# Patient Record
Sex: Male | Born: 1990 | Race: Black or African American | Hispanic: No | Marital: Single | Smoking: Current every day smoker
Health system: Southern US, Community
[De-identification: ages and names within clinical notes are randomized; demographics above are authoritative.]

## PROBLEM LIST (undated history)

## (undated) DIAGNOSIS — Z8249 Family history of ischemic heart disease and other diseases of the circulatory system: Secondary | ICD-10-CM

## (undated) DIAGNOSIS — F17200 Nicotine dependence, unspecified, uncomplicated: Secondary | ICD-10-CM

## (undated) HISTORY — DX: Nicotine dependence, unspecified, uncomplicated: F17.200

## (undated) HISTORY — DX: Family history of ischemic heart disease and other diseases of the circulatory system: Z82.49

## (undated) HISTORY — DX: Morbid (severe) obesity due to excess calories: E66.01

---

## 2011-05-11 ENCOUNTER — Emergency Department (HOSPITAL_COMMUNITY)
Admission: EM | Admit: 2011-05-11 | Discharge: 2011-05-11 | Disposition: A | Discharge status: Home / Self Care | Payer: Self-pay | Attending: Emergency Medicine | Admitting: Emergency Medicine

## 2011-05-11 DIAGNOSIS — K6289 Other specified diseases of anus and rectum: Secondary | ICD-10-CM | POA: Insufficient documentation

## 2011-05-11 DIAGNOSIS — K644 Residual hemorrhoidal skin tags: Secondary | ICD-10-CM | POA: Insufficient documentation

## 2011-07-23 ENCOUNTER — Emergency Department (HOSPITAL_COMMUNITY)
Admission: EM | Admit: 2011-07-23 | Discharge: 2011-07-24 | Disposition: A | Discharge status: Home / Self Care | Payer: Self-pay | Attending: Emergency Medicine | Admitting: Emergency Medicine

## 2011-07-23 DIAGNOSIS — E860 Dehydration: Secondary | ICD-10-CM | POA: Insufficient documentation

## 2011-07-23 DIAGNOSIS — R5381 Other malaise: Secondary | ICD-10-CM | POA: Insufficient documentation

## 2011-07-23 DIAGNOSIS — R110 Nausea: Secondary | ICD-10-CM | POA: Insufficient documentation

## 2011-07-23 LAB — URINALYSIS, ROUTINE W REFLEX MICROSCOPIC
Ketones, ur: 15 mg/dL — AB
Leukocytes, UA: NEGATIVE
Nitrite: NEGATIVE
Protein, ur: NEGATIVE mg/dL
pH: 6 (ref 5.0–8.0)

## 2011-07-23 LAB — PREGNANCY, URINE: Preg Test, Ur: NEGATIVE

## 2012-06-09 ENCOUNTER — Emergency Department (HOSPITAL_COMMUNITY)
Admission: EM | Admit: 2012-06-09 | Discharge: 2012-06-09 | Disposition: A | Discharge status: Home / Self Care | Payer: Self-pay | Attending: Emergency Medicine | Admitting: Emergency Medicine

## 2012-06-09 ENCOUNTER — Encounter (HOSPITAL_COMMUNITY): Payer: Self-pay | Admitting: *Deleted

## 2012-06-09 DIAGNOSIS — X12XXXA Contact with other hot fluids, initial encounter: Secondary | ICD-10-CM | POA: Insufficient documentation

## 2012-06-09 DIAGNOSIS — Z23 Encounter for immunization: Secondary | ICD-10-CM | POA: Insufficient documentation

## 2012-06-09 DIAGNOSIS — Z87891 Personal history of nicotine dependence: Secondary | ICD-10-CM | POA: Insufficient documentation

## 2012-06-09 DIAGNOSIS — T22219A Burn of second degree of unspecified forearm, initial encounter: Secondary | ICD-10-CM | POA: Insufficient documentation

## 2012-06-09 DIAGNOSIS — Y93G3 Activity, cooking and baking: Secondary | ICD-10-CM | POA: Insufficient documentation

## 2012-06-09 DIAGNOSIS — Y990 Civilian activity done for income or pay: Secondary | ICD-10-CM | POA: Insufficient documentation

## 2012-06-09 DIAGNOSIS — T300 Burn of unspecified body region, unspecified degree: Secondary | ICD-10-CM

## 2012-06-09 MED ORDER — OXYCODONE-ACETAMINOPHEN 5-325 MG PO TABS: 1.0000 | ORAL_TABLET | ORAL | Status: AC | PRN

## 2012-06-09 MED ORDER — TETANUS-DIPHTH-ACELL PERTUSSIS 5-2.5-18.5 LF-MCG/0.5 IM SUSP
0.5000 mL | Freq: Once | INTRAMUSCULAR | Status: AC
  Administered 2012-06-09: 0.5 mL via INTRAMUSCULAR
  Filled 2012-06-09: qty 0.5

## 2012-06-09 MED ORDER — SILVER SULFADIAZINE 1 % EX CREA: TOPICAL_CREAM | Freq: Every day | CUTANEOUS | Status: DC

## 2012-06-09 NOTE — ED Provider Notes (Signed)
History   This chart was scribed for Forbes Cellar, MD by Toya Smothers. The patient was seen in room TR05C/TR05C. Patient's care was started at 1656.  CSN: [id number]  Arrival date & time 06/09/12  1656   First MD Initiated Contact with Patient 06/09/12 1921      Chief Complaint  Patient presents with  . Burn   HPI  Cody Hughes is a 21 y.o. male who presents to the Emergency Department complaining of moderate severe constant L forearm pain as the result of pain. Pt states that he was burned as the result of a grease burn while working. This morning the burned area blistered, drained,  and pt removed the skin. Today pain increased as and was aggravated by heat as a result of working conditions (continues to work over [employer] with wound exposed) Denies fever/chills/redness/pus drainage from site. Denies numbness/tinglng/weakness extr  ED Notes, ED Provider Notes from 06/09/12 0000 to 06/09/12 17:21:47       Melissa Rolan Bucco, RN 06/09/2012 17:21      To ED for treatment of grease burn to left forearm. Happened while at work last night. Pt states it blistered this am but he popped the blister. Increased pain when pt went to work today     History reviewed. No pertinent past medical history.  History reviewed. No pertinent past surgical history.  History reviewed. No pertinent family history.  History  Substance Use Topics  . Smoking status: Former Games developer  . Smokeless tobacco: Not on file  . Alcohol Use: Yes     occassional    Review of Systems  Skin: Positive for wound (Burn).  All other systems reviewed and are negative.   Allergies  Review of patient's allergies indicates no known allergies.  Home Medications   Current Outpatient Rx  Name Route Sig Dispense Refill  . OXYCODONE-ACETAMINOPHEN 5-325 MG PO TABS Oral Take 1 tablet by mouth every 4 (four) hours as needed for pain. 10 tablet 0  . SILVER SULFADIAZINE 1 % EX CREA Topical Apply topically daily. 50 g 0     BP 128/71  Pulse 78  Temp 98.4 F (36.9 C) (Oral)  Resp 16  SpO2 99%  Physical Exam  Nursing note and vitals reviewed. Constitutional: He is oriented to person, place, and time. He appears well-developed and well-nourished. No distress.  HENT:  Head: Normocephalic and atraumatic.  Eyes: EOM are normal. Pupils are equal, round, and reactive to light. Right eye exhibits no discharge. Left eye exhibits no discharge. No scleral icterus.  Neck: Neck supple. No tracheal deviation present.  Cardiovascular: Normal rate.   Pulmonary/Chest: Effort normal. No respiratory distress.  Abdominal: Soft. He exhibits no distension and no mass.  Musculoskeletal: Normal range of motion. He exhibits no edema.  Neurological: He is alert and oriented to person, place, and time. No cranial nerve deficit or sensory deficit. Coordination normal.  Skin: Skin is warm and dry.       Radial pulse intact 4 1.5 area of exposed skin with no surrounding erythema, with no surrounding edema.   Psychiatric: He has a normal mood and affect. His behavior is normal.    ED Course  Procedures (including critical care time) DIAGNOSTIC STUDIES: Oxygen Saturation is 99% on room air, normal by my interpretation.    COORDINATION OF CARE: 1922- Discussed necessity of Tetanus shot. Evaluated state of Pt's present illness.   Labs Reviewed - No data to display No results found.   1. Burn  MDM  Second degree/SPT burn. No evidence of infection at this time. Tdap given. Pain control, silvadene. Expect it will heal nicely. PMD f/u. No EMC precluding discharge at this time. Given Precautions for return. PMD f/u.   I personally performed the services described in this documentation, which was scribed in my presence. The recorded information has been reviewed and considered.    Forbes Cellar, MD 06/09/12 2007

## 2012-06-09 NOTE — Discharge Instructions (Signed)
Burn Care Your skin is a natural barrier to infection. It is the largest organ of your body. Burns damage this natural protection. To help prevent infection, it is very important to follow your caregiver's instructions in the care of your burn. Burns are classified as:  First degree. There is only redness of the skin (erythema). No scarring is expected.   Second degree. There is blistering of the skin. Scarring may occur with deeper burns.   Third degree. All layers of the skin are injured, and scarring is expected.  HOME CARE INSTRUCTIONS   Wash your hands well before changing your bandage.   Change your bandage as often as directed by your caregiver.   Remove the old bandage. If the bandage sticks, you may soak it off with cool, clean water.   Cleanse the burn thoroughly but gently with mild soap and water.   Pat the area dry with a clean, dry cloth.   Apply a thin layer of antibacterial cream to the burn.   Apply a clean bandage as instructed by your caregiver.   Keep the bandage as clean and dry as possible.   Elevate the affected area for the first 24 hours, then as instructed by your caregiver.   Only take over-the-counter or prescription medicines for pain, discomfort, or fever as directed by your caregiver.  SEEK IMMEDIATE MEDICAL CARE IF:   You develop excessive pain.   You develop redness, tenderness, swelling, or red streaks near the burn.   The burned area develops yellowish-white fluid (pus) or a bad smell.   You have a fever.  MAKE SURE YOU:   Understand these instructions.   Will watch your condition.   Will get help right away if you are not doing well or get worse.  Document Released: 12/03/2005 Document Revised: 11/22/2011 Document Reviewed: 04/25/2011 [facility] Patient Information 2012 [place], [place].  RESOURCE GUIDE  Dental Problems  Patients with Medicaid: [facility] [phone] [address]                                            [phone] [address] Phone:  [phone]                                                   Phone:  [phone]  If unable to pay or uninsured, contact:  [facility] or [facility]. to become qualified for the adult dental clinic.  Chronic Pain Problems Contact [facility]  [phone] Patients need to be referred by their primary care doctor.  Insufficient Money for Medicine Contact United Way:  call "211" or [facility] 520-683-8139.  No Primary Care Doctor Call [facility]  [phone] Other agencies that provide inexpensive medical care    [facility]  [phone]    [facility]  [phone]    [facility]  [phone]    [facility]  [phone]    [facility]  4080258497    [facility]  [phone]  Psychological Services [facility] Behavioral Health  [phone] [facility]  [phone] [facility] Mental Health   [phone] (emergency services [phone])  Abuse/Neglect [facility] Child Abuse Hotline 403-257-3712 [facility] Child Abuse Hotline 662-224-8206 (After Hours)  Emergency Shelter [facility] [phone]  Maternity Homes Room at the [place] [facility] [place] [phone] [facility] [phone]  MRSA Hotline #:   (954)411-5263    [facility] Resources  [facility]  United [employer]                           [facility] Dept. [address] [place]                     [address]         [address]  Blondell Reveal Phone:  [phone]                                  Phone:  [phone]                   Phone:  [phone]  [facility] Mental [facility] Phone:  [phone]  [facility] Child Abuse Hotline [phone] (After Hours)

## 2012-06-09 NOTE — ED Notes (Signed)
To ED for treatment of grease burn to left forearm. Happened while at work last night. Pt states it blistered this am but he popped the blister. Increased pain when pt went to work today

## 2013-03-11 ENCOUNTER — Encounter (HOSPITAL_COMMUNITY): Payer: Self-pay | Admitting: *Deleted

## 2013-03-11 ENCOUNTER — Emergency Department (HOSPITAL_COMMUNITY)
Admission: EM | Admit: 2013-03-11 | Discharge: 2013-03-11 | Disposition: A | Discharge status: Home / Self Care | Payer: Self-pay | Attending: Emergency Medicine | Admitting: Emergency Medicine

## 2013-03-11 DIAGNOSIS — K219 Gastro-esophageal reflux disease without esophagitis: Secondary | ICD-10-CM | POA: Insufficient documentation

## 2013-03-11 DIAGNOSIS — E669 Obesity, unspecified: Secondary | ICD-10-CM | POA: Insufficient documentation

## 2013-03-11 DIAGNOSIS — R112 Nausea with vomiting, unspecified: Secondary | ICD-10-CM | POA: Insufficient documentation

## 2013-03-11 DIAGNOSIS — R197 Diarrhea, unspecified: Secondary | ICD-10-CM | POA: Insufficient documentation

## 2013-03-11 DIAGNOSIS — F17200 Nicotine dependence, unspecified, uncomplicated: Secondary | ICD-10-CM | POA: Insufficient documentation

## 2013-03-11 LAB — URINALYSIS, ROUTINE W REFLEX MICROSCOPIC
Bilirubin Urine: NEGATIVE
Glucose, UA: NEGATIVE mg/dL
Hgb urine dipstick: NEGATIVE
Nitrite: NEGATIVE
Specific Gravity, Urine: 1.026 (ref 1.005–1.030)
pH: 5.5 (ref 5.0–8.0)

## 2013-03-11 LAB — COMPREHENSIVE METABOLIC PANEL
AST: 23 U/L (ref 0–37)
Albumin: 3.4 g/dL — ABNORMAL LOW (ref 3.5–5.2)
BUN: 15 mg/dL (ref 6–23)
Creatinine, Ser: 0.98 mg/dL (ref 0.50–1.35)
Potassium: 4 mEq/L (ref 3.5–5.1)
Total Protein: 6.3 g/dL (ref 6.0–8.3)

## 2013-03-11 LAB — CBC WITH DIFFERENTIAL/PLATELET
Eosinophils Relative: 3 % (ref 0–5)
HCT: 40 % (ref 39.0–52.0)
Lymphocytes Relative: 54 % — ABNORMAL HIGH (ref 12–46)
Lymphs Abs: 2.1 10*3/uL (ref 0.7–4.0)
MCV: 81.3 fL (ref 78.0–100.0)
Monocytes Absolute: 0.3 10*3/uL (ref 0.1–1.0)
Monocytes Relative: 7 % (ref 3–12)
RBC: 4.92 MIL/uL (ref 4.22–5.81)
WBC: 3.9 10*3/uL — ABNORMAL LOW (ref 4.0–10.5)

## 2013-03-11 LAB — LIPASE, BLOOD: Lipase: 29 U/L (ref 11–59)

## 2013-03-11 MED ORDER — ONDANSETRON HCL 4 MG/2ML IJ SOLN
4.0000 mg | Freq: Once | INTRAMUSCULAR | Status: AC
  Administered 2013-03-11: 4 mg via INTRAVENOUS
  Filled 2013-03-11: qty 2

## 2013-03-11 MED ORDER — OMEPRAZOLE 20 MG PO CPDR: 20.0000 mg | DELAYED_RELEASE_CAPSULE | Freq: Every day | ORAL | Status: DC

## 2013-03-11 MED ORDER — SUCRALFATE 1 G PO TABS: 1.0000 g | ORAL_TABLET | Freq: Four times a day (QID) | ORAL | Status: DC

## 2013-03-11 MED ORDER — SODIUM CHLORIDE 0.9 % IV BOLUS (SEPSIS)
1000.0000 mL | Freq: Once | INTRAVENOUS | Status: AC
  Administered 2013-03-11: 1000 mL via INTRAVENOUS

## 2013-03-11 MED ORDER — PANTOPRAZOLE SODIUM 40 MG PO TBEC
40.0000 mg | DELAYED_RELEASE_TABLET | Freq: Once | ORAL | Status: AC
  Administered 2013-03-11: 40 mg via ORAL
  Filled 2013-03-11: qty 1

## 2013-03-11 MED ORDER — GI COCKTAIL ~~LOC~~
30.0000 mL | Freq: Once | ORAL | Status: AC
  Administered 2013-03-11: 30 mL via ORAL
  Filled 2013-03-11: qty 30

## 2013-03-11 NOTE — ED Notes (Signed)
Pt to ED c/o emesis followed by epigastric pain and diarrhea.  Denies fevers.

## 2013-03-11 NOTE — ED Provider Notes (Signed)
History     CSN: [id number]  Arrival date & time 03/11/13  1826   First MD Initiated Contact with Patient 03/11/13 1903      Chief Complaint  Patient presents with  . Abdominal Pain  . Emesis    (Consider location/radiation/quality/duration/timing/severity/associated sxs/prior treatment) HPI Cody Hughes is a 22 year old male who presents to the ED for abdominal pain and diarrhea. The abdominal pain started yesterday while he was on a walk. He reports the pain is intermittent, a sharp, burning sensation in his epigastric region, and associated with nausea. He states the pain is usually worse with meals and fluids and reports eating a diet high in fat. Yesterday he had pizza for lunch and dinner. Today he had a [place] sub for lunch. The pain is worse with movement and better at rest and lying down. He had one episode of emesis this morning, but states it was a small amount, and watery.  He has also had 5 episodes of diarrhea since yesterday. He states it is watery and dark brown. He denies fever, chills, chest pain, SOB, hematemesis, hematochezia, melena, or dysuria. He denies any known sick contacts. He has not tried any medications for the pain or diarrhea. He denies taking excessive NSAIDs. He is a current smoker and smokes about 1/2 pack a day, but since the pain started he states he only smoked 3 cigarettes today.  History reviewed. No pertinent past medical history.  History reviewed. No pertinent past surgical history.  No family history on file.  History  Substance Use Topics  . Smoking status: Current Every Day Smoker -- 0.50 packs/day    Types: Cigarettes  . Smokeless tobacco: Not on file  . Alcohol Use: Yes     Comment: occassional      Review of Systems All other systems negative except as documented in the HPI. All pertinent positives and negatives as reviewed in the HPI.  Allergies  Review of patient's allergies indicates no known allergies.  Home Medications   No current outpatient prescriptions on file.  BP 126/70  Pulse 79  Temp(Src) 98.3 F (36.8 C) (Oral)  Resp 18  SpO2 99%  Physical Exam  Nursing note and vitals reviewed. Constitutional: He is oriented to person, place, and time. He appears well-developed and well-nourished. No distress.  Obese.  HENT:  Head: Normocephalic and atraumatic.  Mouth/Throat: Oropharynx is clear and moist.  Eyes: Pupils are equal, round, and reactive to light.  Neck: Normal range of motion. Neck supple.  Cardiovascular: Normal rate, regular rhythm and normal heart sounds.  Exam reveals no gallop and no friction rub.   No murmur heard. Pulmonary/Chest: Effort normal and breath sounds normal.  Abdominal: Soft. Bowel sounds are normal. He exhibits no distension. There is no tenderness. There is no rebound and no guarding.  Neurological: He is alert and oriented to person, place, and time.  Skin: Skin is warm and dry. He is not diaphoretic.    ED Course  Procedures (including critical care time)  Labs Reviewed  COMPREHENSIVE METABOLIC PANEL - Abnormal; Notable for the following:    Albumin 3.4 (*)    Total Bilirubin 0.1 (*)    All other components within normal limits  CBC WITH DIFFERENTIAL - Abnormal; Notable for the following:    WBC 3.9 (*)    Neutrophils Relative 35 (*)    Neutro Abs 1.4 (*)    Lymphocytes Relative 54 (*)    All other components within normal limits  LIPASE, BLOOD  URINALYSIS, ROUTINE W REFLEX MICROSCOPIC  The patient will be treated for GERD like symptoms based on his HPI and PE.The patient is PERC negative. The patient felt relief of symptoms with Protonix and GI Cocktail. Follow up with GI as needed.    MDM        Carlyle Dolly, PA-C 03/11/13 2043  Carlyle Dolly, PA-C 03/11/13 2044  Carlyle Dolly, PA-C 03/12/13 [phone]

## 2013-03-15 NOTE — ED Provider Notes (Signed)
Medical screening examination/treatment/procedure(s) were performed by non-physician practitioner and as supervising physician I was immediately available for consultation/collaboration.   Joya Gaskins, MD 03/15/13 0830

## 2013-07-08 ENCOUNTER — Emergency Department (HOSPITAL_COMMUNITY)
Admission: EM | Admit: 2013-07-08 | Discharge: 2013-07-08 | Disposition: A | Discharge status: Home / Self Care | Payer: Self-pay | Attending: Emergency Medicine | Admitting: Emergency Medicine

## 2013-07-08 ENCOUNTER — Encounter (HOSPITAL_COMMUNITY): Payer: Self-pay | Admitting: Emergency Medicine

## 2013-07-08 DIAGNOSIS — G5600 Carpal tunnel syndrome, unspecified upper limb: Secondary | ICD-10-CM | POA: Insufficient documentation

## 2013-07-08 DIAGNOSIS — G5602 Carpal tunnel syndrome, left upper limb: Secondary | ICD-10-CM

## 2013-07-08 DIAGNOSIS — F17200 Nicotine dependence, unspecified, uncomplicated: Secondary | ICD-10-CM | POA: Insufficient documentation

## 2013-07-08 MED ORDER — IBUPROFEN 400 MG PO TABS
800.0000 mg | ORAL_TABLET | Freq: Once | ORAL | Status: AC
  Administered 2013-07-08: 800 mg via ORAL
  Filled 2013-07-08: qty 2

## 2013-07-08 MED ORDER — NAPROXEN 375 MG PO TABS: 375.0000 mg | ORAL_TABLET | Freq: Two times a day (BID) | ORAL | Status: DC

## 2013-07-08 NOTE — ED Notes (Signed)
Pt c/o left wrist pain x 2 days; pt denies obvious injury; CMS intact

## 2013-07-08 NOTE — ED Provider Notes (Signed)
History    This chart was scribed for a non-physician practitioner working with Carleene Cooper III, MD by Jiles Prows, ED scribe. This patient was seen in room TR07C/TR07C and the patient's care was started at 6:02 PM.  CSN: [id number] Arrival date & time 07/08/13  1748   Chief Complaint  Patient presents with  . Wrist Pain   The history is provided by the patient and medical records. No language interpreter was used.   HPI Comments: Cody Hughes is a 22 y.o. male who presents to the Emergency Department complaining of sudden, moderate pain to left wrist onset 5 days ago.  Pt reports that sometimes he sleeps on it and will wake up with pain.  He reports he works at [employer] and does a lot of moving boxes in addition to working on [employer].  Pt denies headache, diaphoresis, fever, chills, nausea, vomiting, diarrhea, weakness, cough, SOB and any other pain.  Pt denies any injury to the area.  History reviewed. No pertinent past medical history. History reviewed. No pertinent past surgical history. History reviewed. No pertinent family history. History  Substance Use Topics  . Smoking status: Current Every Day Smoker -- 0.50 packs/day    Types: Cigarettes  . Smokeless tobacco: Not on file  . Alcohol Use: Yes     Comment: occassional    Review of Systems  All other systems reviewed and are negative.    Allergies  Review of patient's allergies indicates no known allergies.  Home Medications   Current Outpatient Rx  Name  Route  Sig  Dispense  Refill  . naproxen (NAPROSYN) 375 MG tablet   Oral   Take 1 tablet (375 mg total) by mouth 2 (two) times daily.   20 tablet   0    BP 137/87  Pulse 98  Temp(Src) 98.3 F (36.8 C) (Oral)  Resp 18  SpO2 98% Physical Exam  Nursing note and vitals reviewed. Constitutional: He is oriented to person, place, and time. He appears well-developed and well-nourished. No distress.  HENT:  Head: Normocephalic and atraumatic.  Eyes: EOM  are normal.  Neck: Neck supple. No tracheal deviation present.  Cardiovascular: Normal rate.   Pulmonary/Chest: Effort normal. No respiratory distress.  Musculoskeletal:       Left wrist: He exhibits decreased range of motion, tenderness and swelling. He exhibits no bony tenderness, no effusion, no crepitus, no deformity and no laceration.  + tinel sign  Neurological: He is alert and oriented to person, place, and time.  Skin: Skin is warm and dry.  Psychiatric: He has a normal mood and affect. His behavior is normal.    ED Course  Procedures (including critical care time) DIAGNOSTIC STUDIES: Filed Vitals:   07/08/13 1800  BP: 137/87  Pulse: 98  Temp: 98.3 F (36.8 C)  TempSrc: Oral  Resp: 18  SpO2: 98%     COORDINATION OF CARE: 6:04 PM - Discussed ED treatment with pt at bedside including pain management, antiinflammatory medication, splint, and follow up with orthopedic and pt agrees.  Advised pt to ice area.   Labs Reviewed - No data to display No results found. 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 It has been determined that no acute conditions requiring further emergency intervention are present at this time. The patient/guardian have been advised of the diagnosis and plan. We have discussed signs and symptoms that warrant  return to the ED, such as changes or worsening in symptoms.  Vital signs are stable at discharge. Filed Vitals:   07/08/13 1800  BP: 137/87  Pulse: 98  Temp: 98.3 F (36.8 C)  Resp: 18    Patient/guardian has voiced understanding and agreed to follow-up with the PCP or specialist.  I personally performed the services described in this documentation, which was scribed in my presence. The recorded information has been reviewed and is accurate.     Dorthula Matas, PA-C 07/08/13 1819

## 2013-07-09 NOTE — ED Provider Notes (Signed)
Medical screening examination/treatment/procedure(s) were performed by non-physician practitioner and as supervising physician I was immediately available for consultation/collaboration.   Carleene Cooper III, MD 07/09/13 [phone]

## 2013-07-12 ENCOUNTER — Emergency Department (HOSPITAL_COMMUNITY)
Admission: EM | Admit: 2013-07-12 | Discharge: 2013-07-12 | Disposition: A | Discharge status: Home / Self Care | Payer: Self-pay | Attending: Emergency Medicine | Admitting: Emergency Medicine

## 2013-07-12 ENCOUNTER — Encounter (HOSPITAL_COMMUNITY): Payer: Self-pay | Admitting: Emergency Medicine

## 2013-07-12 DIAGNOSIS — H6123 Impacted cerumen, bilateral: Secondary | ICD-10-CM

## 2013-07-12 DIAGNOSIS — F17200 Nicotine dependence, unspecified, uncomplicated: Secondary | ICD-10-CM | POA: Insufficient documentation

## 2013-07-12 DIAGNOSIS — H6120 Impacted cerumen, unspecified ear: Secondary | ICD-10-CM | POA: Insufficient documentation

## 2013-07-12 DIAGNOSIS — H9190 Unspecified hearing loss, unspecified ear: Secondary | ICD-10-CM | POA: Insufficient documentation

## 2013-07-12 MED ORDER — DOCUSATE SODIUM 150 MG/15ML PO LIQD: 50.0000 mg | Freq: Every day | ORAL | Status: DC

## 2013-07-12 MED ORDER — CARBAMIDE PEROXIDE 6.5 % OT SOLN: 5.0000 [drp] | Freq: Two times a day (BID) | OTIC | Status: DC

## 2013-07-12 NOTE — ED Provider Notes (Signed)
  CSN: [id number]     Arrival date & time 07/12/13  1218 History     First MD Initiated Contact with Patient 07/12/13 1230     Chief Complaint  Patient presents with  . Ear Problem   (Consider location/radiation/quality/duration/timing/severity/associated sxs/prior Treatment) HPI Comments: Pt woke up this morning with muffled hearing and a feeling of pressure in his L ear. The pressure decreases with yawning or opening his jaw widely. He denies pain in the ear, fever, chills, cough, sneezing, runny nose, headache, or sore throat. No one else at home has had symptoms. He had not placed any objects into the ear canal recently. He had not been swimming recently. 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    History reviewed. No pertinent past medical history. History reviewed. No pertinent past surgical history. No family history on file. History  Substance Use Topics  . Smoking status: Current Every Day Smoker -- 0.50 packs/day    Types: Cigarettes  . Smokeless tobacco: Not on file  . Alcohol Use: Yes     Comment: occassional    Review of Systems  Constitutional: Negative for fever and chills.  HENT: Positive for hearing loss. Negative for ear pain, congestion, sore throat, rhinorrhea, sneezing, trouble swallowing, postnasal drip, sinus pressure, tinnitus and ear discharge.   Eyes: Negative for discharge and redness.  Respiratory: Negative for cough.   Cardiovascular: Negative for chest pain.  Gastrointestinal: Negative for nausea and vomiting.  Genitourinary: Negative for dysuria.  Musculoskeletal: Negative for myalgias and arthralgias.  Skin: Negative for rash.  Neurological: Negative for headaches.    Allergies  Review of patient's allergies indicates no known allergies.  Home Medications  No current outpatient prescriptions on file. BP 153/83  Pulse 83  Temp(Src) 97.5 F (36.4 C) (Oral)  Resp 18  SpO2 100% Physical Exam   Constitutional: He is oriented to person, place, and time.  HENT:  Head: Normocephalic and atraumatic.  Right Ear: Hearing normal.  Left Ear: Hearing normal.  Nose: Nose normal.  Mouth/Throat: Oropharynx is clear and moist. No oropharyngeal exudate.  External ears non-erythematous. Tragus and auricle not tender to palpation bilaterally. Ear canals non-erythematous. Unable to visualize TMs bilaterally due to cerumen impaction. Cerumen is dark, brown-black in color.   Eyes: Conjunctivae are normal. Pupils are equal, round, and reactive to light. Right eye exhibits no discharge. Left eye exhibits no discharge.  Neck: Normal range of motion.  Cardiovascular: Normal rate, regular rhythm and normal heart sounds.   Pulmonary/Chest: Effort normal and breath sounds normal. No respiratory distress.  Lymphadenopathy:    He has no cervical adenopathy.  Neurological: He is alert and oriented to person, place, and time.  Skin: Skin is warm.    ED Course   Procedures (including critical care time)  Labs Reviewed - No data to display No results found. 1. Cerumen impaction, bilateral     MDM  Cerumen impaction likely causing muffled hearing and pressure in L ear. No evidence of acute otitis media or mastoiditis. Ear wash performed in emergency department. Advised patient to avoid using q-tips or other objects in ear canal. Patient to wear ear plugs when attending band practice.   Arthor Captain, PA-C 07/12/13 1732

## 2013-07-12 NOTE — ED Notes (Signed)
Pt reports waking up this AM with difficulty hearing out of left ear. Denies pain. Denies injury to area.

## 2013-07-14 NOTE — ED Provider Notes (Signed)
Medical screening examination/treatment/procedure(s) were performed by non-physician practitioner and as supervising physician I was immediately available for consultation/collaboration.   Jahkeem Kurka III, MD 07/14/13 1743 

## 2013-08-04 ENCOUNTER — Encounter (HOSPITAL_COMMUNITY): Payer: Self-pay | Admitting: Emergency Medicine

## 2013-08-04 ENCOUNTER — Emergency Department (HOSPITAL_COMMUNITY)
Admission: EM | Admit: 2013-08-04 | Discharge: 2013-08-04 | Disposition: A | Discharge status: Home / Self Care | Payer: Self-pay | Attending: Emergency Medicine | Admitting: Emergency Medicine

## 2013-08-04 ENCOUNTER — Emergency Department (HOSPITAL_COMMUNITY): Payer: Self-pay

## 2013-08-04 DIAGNOSIS — F17200 Nicotine dependence, unspecified, uncomplicated: Secondary | ICD-10-CM | POA: Insufficient documentation

## 2013-08-04 DIAGNOSIS — S93409A Sprain of unspecified ligament of unspecified ankle, initial encounter: Secondary | ICD-10-CM | POA: Insufficient documentation

## 2013-08-04 DIAGNOSIS — Y9389 Activity, other specified: Secondary | ICD-10-CM | POA: Insufficient documentation

## 2013-08-04 DIAGNOSIS — W010XXA Fall on same level from slipping, tripping and stumbling without subsequent striking against object, initial encounter: Secondary | ICD-10-CM | POA: Insufficient documentation

## 2013-08-04 DIAGNOSIS — Y990 Civilian activity done for income or pay: Secondary | ICD-10-CM | POA: Insufficient documentation

## 2013-08-04 DIAGNOSIS — Y9269 Other specified industrial and construction area as the place of occurrence of the external cause: Secondary | ICD-10-CM | POA: Insufficient documentation

## 2013-08-04 DIAGNOSIS — Z79899 Other long term (current) drug therapy: Secondary | ICD-10-CM | POA: Insufficient documentation

## 2013-08-04 DIAGNOSIS — S93402A Sprain of unspecified ligament of left ankle, initial encounter: Secondary | ICD-10-CM

## 2013-08-04 NOTE — ED Notes (Addendum)
PT. SLIPPED ON GREASY FLOOR AT WORK ( COOK OUT) THIS EVENING AND INJURED LEFT  ANKLE WITH PAIN / SLIGHT SWELLING. AMBULATORY.

## 2013-08-04 NOTE — ED Provider Notes (Signed)
CSN: [id number]     Arrival date & time 08/04/13  2219 History  This chart was scribed for Antony Madura, PA working with Enid Skeens, MD by Quintella Reichert, ED Scribe. This patient was seen in room TR07C/TR07C and the patient's care was started at 11:13 PM.   Chief Complaint  Patient presents with  . Ankle Pain   The history is provided by the patient. No language interpreter was used.   HPI Comments: Cody Hughes is a 22 y.o. male who presents to the Emergency Department complaining of a left ankle injury sustained 5 hours ago when he slipped on a greasy floor at work and twisted the ankle.  Patient states he immediately developed constant, moderate pain to the ankle that has improved slightly since onset.  Pain is exacerbated by bearing weight.  He is ambulatory with pain.  He has not taken any pain medications pta. He denies weakness, numbness or tingling.  History reviewed. No pertinent past medical history.  History reviewed. No pertinent past surgical history.  No family history on file.  History  Substance Use Topics  . Smoking status: Current Every Day Smoker -- 0.50 packs/day    Types: Cigarettes  . Smokeless tobacco: Not on file  . Alcohol Use: Yes     Comment: occassional    Review of Systems  Musculoskeletal: Positive for arthralgias.  Neurological: Negative for weakness and numbness.  All other systems reviewed and are negative.   Allergies  Review of patient's allergies indicates no known allergies.  Home Medications   Current Outpatient Rx  Name  Route  Sig  Dispense  Refill  . carbamide peroxide (DEBROX) 6.5 % otic solution   Both Ears   Place 5 drops into both ears 2 (two) times daily.   30 mL   0   . Docusate Sodium 150 MG/15ML syrup   Oral   Take 5 mLs (50 mg total) by mouth daily.   10 mL   0    BP 125/75  Pulse 76  Temp(Src) 98 F (36.7 C) (Oral)  Resp 16  SpO2 98%  Physical Exam  Nursing note and vitals  reviewed. Constitutional: He is oriented to person, place, and time. He appears well-developed and well-nourished. No distress.  HENT:  Head: Normocephalic and atraumatic.  Eyes: Conjunctivae and EOM are normal. No scleral icterus.  Neck: Neck supple. No tracheal deviation present.  Cardiovascular: Normal rate, regular rhythm and intact distal pulses.   Distal radial and dorsalis pedis pulses 2+ bilaterally. Capillary refill normal.  Pulmonary/Chest: Effort normal. No respiratory distress.  Musculoskeletal: Normal range of motion.       Left ankle: Tenderness.  Tenderness to anterior aspect of left ankle. Normal range of motion.  Neurological: He is alert and oriented to person, place, and time. He has normal reflexes.  No sensory or motor deficits appreciated. DTRs normal and symmetric.  Skin: Skin is warm and dry. No rash noted. He is not diaphoretic. No erythema. No pallor.  Psychiatric: He has a normal mood and affect. His behavior is normal.    ED Course  Procedures (including critical care time)  DIAGNOSTIC STUDIES: Oxygen Saturation is 98% on room air, normal by my interpretation.    COORDINATION OF CARE: 11:15 PM-Informed pt that imaging was negative for fracture and symptoms are likely due to ankle sprain.  Discussed treatment plan which includes ankle brace, ibuprofen, RICE treatment and f/u with orthopedics if symptoms do not improve with pt at bedside  and pt agreed to plan.   Labs Reviewed - No data to display  Dg Ankle Complete Left  08/04/2013   *RADIOLOGY REPORT*  Clinical Data: Left ankle pain secondary to a twisting injury.  LEFT ANKLE COMPLETE - 3+ VIEW  Comparison: None.  Findings: There is no fracture, dislocation, or joint effusion. Dorsal spurring on the distal talus and on the navicular.  IMPRESSION: No acute abnormality.   Original Report Authenticated By: Francene Boyers, M.D.    1. Ankle sprain, left, initial encounter     MDM  Uncomplicated ankle sprain.  Patient neurovascularly intact and ambulatory. There is no pallor, pulselessness, poikilothermia, or paresthesias to suspect complicating injury. DG ankle without evidence of acute fracture, dislocation, effusion, or other acute bony abnormality. ASO ankle applied and ED for stability and comfort. Appropriate for discharge with RICE instruction. Ibuprofen advised for pain management. Orthopedic referral provided should symptoms not improved recommended treatment. Indications for ED return discussed and patient agreeable to plan.  I personally performed the services described in this documentation, which was scribed in my presence. The recorded information has been reviewed and is accurate.     Antony Madura, PA-C 08/09/13 [phone]

## 2013-08-09 NOTE — ED Provider Notes (Signed)
Medical screening examination/treatment/procedure(s) were performed by non-physician practitioner and as supervising physician I was immediately available for consultation/collaboration.   Enid Skeens, MD 08/09/13 [phone]

## 2013-09-01 ENCOUNTER — Emergency Department (HOSPITAL_COMMUNITY)
Admission: EM | Admit: 2013-09-01 | Discharge: 2013-09-01 | Disposition: A | Discharge status: Home / Self Care | Payer: Self-pay | Attending: Emergency Medicine | Admitting: Emergency Medicine

## 2013-09-01 ENCOUNTER — Encounter (HOSPITAL_COMMUNITY): Payer: Self-pay | Admitting: *Deleted

## 2013-09-01 DIAGNOSIS — R059 Cough, unspecified: Secondary | ICD-10-CM | POA: Insufficient documentation

## 2013-09-01 DIAGNOSIS — J029 Acute pharyngitis, unspecified: Secondary | ICD-10-CM | POA: Insufficient documentation

## 2013-09-01 DIAGNOSIS — R05 Cough: Secondary | ICD-10-CM | POA: Insufficient documentation

## 2013-09-01 DIAGNOSIS — F17200 Nicotine dependence, unspecified, uncomplicated: Secondary | ICD-10-CM | POA: Insufficient documentation

## 2013-09-01 DIAGNOSIS — J069 Acute upper respiratory infection, unspecified: Secondary | ICD-10-CM | POA: Insufficient documentation

## 2013-09-01 DIAGNOSIS — Z79899 Other long term (current) drug therapy: Secondary | ICD-10-CM | POA: Insufficient documentation

## 2013-09-01 MED ORDER — FLUTICASONE PROPIONATE 50 MCG/ACT NA SUSP: 2.0000 | Freq: Every day | NASAL | Status: DC

## 2013-09-01 NOTE — ED Notes (Signed)
Pt reports cold symptoms since last night.having headache, cough and congestion. No acute disterss noted at triage.

## 2013-09-01 NOTE — ED Provider Notes (Signed)
Medical screening examination/treatment/procedure(s) were performed by non-physician practitioner and as supervising physician I was immediately available for consultation/collaboration.  Bain Whichard, MD 09/01/13 2233 

## 2013-09-01 NOTE — ED Provider Notes (Signed)
CSN: [id number]     Arrival date & time 09/01/13  1514 History  This chart was scribed for non-physician practitioner, [name], PA, working with [name], MD, by [facility] ED Scribe. This patient was seen in room TR07C/TR07C and the patient's care was started at 4:25 PM.  Chief Complaint  Patient presents with  . URI    The history is provided by the patient. No language interpreter was used.    HPI Comments: Cody Hughes is a 22 y.o. male who presents to the Emergency Department complaining of cold symptoms onset last night, primarily complaining of a productive, painful cough. He also reports associated nasal congestion, rhinorrhea, generalized headache and a sore throat. He states that he has taken OTC medications with mild relief. He states that he has had some sick contacts at work. He denies fever, chills or any other symptoms.  History reviewed. No pertinent past medical history. History reviewed. No pertinent past surgical history. History reviewed. No pertinent family history.  History  Substance Use Topics  . Smoking status: Current Every Day Smoker -- 0.50 packs/day    Types: Cigarettes  . Smokeless tobacco: Not on file  . Alcohol Use: Yes     Comment: occassional    Review of Systems  Constitutional: Negative for fever and chills.  HENT: Positive for congestion, sore throat and rhinorrhea.   Respiratory: Positive for cough.   Neurological: Positive for headaches.  All other systems reviewed and are negative.   Allergies  Review of patient's allergies indicates no known allergies.  Home Medications   Current Outpatient Rx  Name  Route  Sig  Dispense  Refill  . OVER THE COUNTER MEDICATION   Oral   Take 1 tablet by mouth daily as needed (congestion).          Triage Vitals: BP 134/75  Pulse 108  Temp(Src) 98.5 F (36.9 C) (Oral)  Resp 18  Ht 5\' 2"  (1.575 m)  Wt 229 lb (103.874 kg)  BMI 41.87 kg/m2  SpO2 96%  Physical Exam  Nursing note  and vitals reviewed. Constitutional: He is oriented to person, place, and time. He appears well-developed and well-nourished. No distress.  HENT:  Head: Normocephalic and atraumatic.  Mucosal edema. Postnasal drip. Postoropharyngeal erythema without edema or exudate. No adenopathy.  Eyes: Conjunctivae and EOM are normal.  Neck: Normal range of motion. Neck supple.  Cardiovascular: Normal rate, regular rhythm and normal heart sounds.   Pulmonary/Chest: Effort normal and breath sounds normal. No respiratory distress. He has no wheezes.  Musculoskeletal: Normal range of motion. He exhibits no edema.  Lymphadenopathy:    He has no cervical adenopathy.  Neurological: He is alert and oriented to person, place, and time.  Skin: Skin is warm and dry.  Psychiatric: He has a normal mood and affect. His behavior is normal.    ED Course  Procedures (including critical care time)  DIAGNOSTIC STUDIES: Oxygen Saturation is 96% on RA, normal by my interpretation.    COORDINATION OF CARE: 4:30 PM- Pt advised of suspected URI and advised of plan for treatment, including discharge with a prescription for Flonase at home. Also advised taking Mucinex at home. Pt agrees with plan for treatment.  Labs Review Labs Reviewed - No data to display Imaging Review No results found.  MDM   1. URI (upper respiratory infection)    Patient with URI. Advised rest, increase fluids. Nasal saline rinses, Mucinex. Prescription for Flonase. Return precautions discussed. Patient states understanding of  plan and is agreeable.   I personally performed the services described in this documentation, which was scribed in my presence. The recorded information has been reviewed and is accurate.   [name], PA-C 09/01/13 1652

## 2014-04-01 ENCOUNTER — Emergency Department (HOSPITAL_COMMUNITY)
Admission: EM | Admit: 2014-04-01 | Discharge: 2014-04-01 | Disposition: A | Discharge status: Home / Self Care | Payer: Self-pay | Attending: Emergency Medicine | Admitting: Emergency Medicine

## 2014-04-01 ENCOUNTER — Encounter (HOSPITAL_COMMUNITY): Payer: Self-pay | Admitting: Emergency Medicine

## 2014-04-01 DIAGNOSIS — K029 Dental caries, unspecified: Secondary | ICD-10-CM | POA: Insufficient documentation

## 2014-04-01 DIAGNOSIS — F17200 Nicotine dependence, unspecified, uncomplicated: Secondary | ICD-10-CM | POA: Insufficient documentation

## 2014-04-01 DIAGNOSIS — K047 Periapical abscess without sinus: Secondary | ICD-10-CM | POA: Insufficient documentation

## 2014-04-01 MED ORDER — HYDROCODONE-ACETAMINOPHEN 5-325 MG PO TABS
1.0000 | ORAL_TABLET | Freq: Once | ORAL | Status: AC
  Administered 2014-04-01: 1 via ORAL
  Filled 2014-04-01: qty 1

## 2014-04-01 MED ORDER — IBUPROFEN 600 MG PO TABS: 600.0000 mg | ORAL_TABLET | Freq: Four times a day (QID) | ORAL | Status: DC | PRN

## 2014-04-01 MED ORDER — HYDROCODONE-ACETAMINOPHEN 5-325 MG PO TABS: 1.0000 | ORAL_TABLET | Freq: Four times a day (QID) | ORAL | Status: DC | PRN

## 2014-04-01 MED ORDER — KETOROLAC TROMETHAMINE 60 MG/2ML IM SOLN
60.0000 mg | Freq: Once | INTRAMUSCULAR | Status: AC
  Administered 2014-04-01: 60 mg via INTRAMUSCULAR
  Filled 2014-04-01: qty 2

## 2014-04-01 MED ORDER — PENICILLIN V POTASSIUM 500 MG PO TABS: 500.0000 mg | ORAL_TABLET | Freq: Four times a day (QID) | ORAL | Status: AC

## 2014-04-01 NOTE — ED Notes (Signed)
The pt has had a toothache for 2 days with swelling to his lt jaw

## 2014-04-01 NOTE — Discharge Instructions (Signed)
°  Dental Abscess °A dental abscess is a collection of infected fluid (pus) from a bacterial infection in the inner part of the tooth (pulp). It usually occurs at the end of the tooth's root.  °CAUSES  °· Severe tooth decay. °· Trauma to the tooth that allows bacteria to enter into the pulp, such as a broken or chipped tooth. °SYMPTOMS  °· Severe pain in and around the infected tooth. °· Swelling and redness around the abscessed tooth or in the mouth or face. °· Tenderness. °· Pus drainage. °· Bad breath. °· Bitter taste in the mouth. °· Difficulty swallowing. °· Difficulty opening the mouth. °· Nausea. °· Vomiting. °· Chills. °· Swollen neck glands. °DIAGNOSIS  °· A medical and dental history will be taken. °· An examination will be performed by tapping on the abscessed tooth. °· X-rays may be taken of the tooth to identify the abscess. °TREATMENT °The goal of treatment is to eliminate the infection. You may be prescribed antibiotic medicine to stop the infection from spreading. A root canal may be performed to save the tooth. If the tooth cannot be saved, it may be pulled (extracted) and the abscess may be drained.  °HOME CARE INSTRUCTIONS °· Only take over-the-counter or prescription medicines for pain, fever, or discomfort as directed by your caregiver. °· Rinse your mouth (gargle) often with salt water (¼ tsp salt in 8 oz [250 ml] of warm water) to relieve pain or swelling. °· Do not drive after taking pain medicine (narcotics). °· Do not apply heat to the outside of your face. °· Return to your dentist for further treatment as directed. °SEEK MEDICAL CARE IF: °· Your pain is not helped by medicine. °· Your pain is getting worse instead of better. °SEEK IMMEDIATE MEDICAL CARE IF: °· You have a fever or persistent symptoms for more than 2 3 days. °· You have a fever and your symptoms suddenly get worse. °· You have chills or a very bad headache. °· You have problems breathing or swallowing. °· You have trouble  opening your mouth. °· You have swelling in the neck or around the eye. °Document Released: 12/03/2005 Document Revised: 08/27/2012 Document Reviewed: 03/13/2011 °ExitCare® Patient Information ©2014 [employer]. ° ° °

## 2014-04-01 NOTE — ED Provider Notes (Signed)
CSN: [id number]     Arrival date & time 04/01/14  0551 History   First MD Initiated Contact with Patient 04/01/14 0602     Chief Complaint  Patient presents with  . Dental Problem     (Consider location/radiation/quality/duration/timing/severity/associated sxs/prior Treatment) HPI  This is a 23 year old male who presents with dental pain. Reports no significant past medical history. Reports one-day history of worsening left-sided dental and jaw pain. Denies any difficulty swallowing or breathing.  Patient states that he noted an abscess adjacent to his premolar. Rates pain at 8/10. He took Tylenol extra strength without any relief. Does not have a dentist.  History reviewed. No pertinent past medical history. History reviewed. No pertinent past surgical history. No family history on file. History  Substance Use Topics  . Smoking status: Current Every Day Smoker -- 0.50 packs/day    Types: Cigarettes  . Smokeless tobacco: Not on file  . Alcohol Use: Yes     Comment: occassional    Review of Systems  HENT: Positive for dental problem. Negative for facial swelling and sore throat.   Respiratory: Negative for shortness of breath.   All other systems reviewed and are negative.     Allergies  Review of patient's allergies indicates no known allergies.  Home Medications   Prior to Admission medications   Not on File   BP 134/74  Temp(Src) 97.7 F (36.5 C) (Oral)  Resp 18  SpO2 99% Physical Exam  Nursing note and vitals reviewed. Constitutional: He is oriented to person, place, and time. He appears well-developed and well-nourished. No distress.  HENT:  Head: Normocephalic and atraumatic.  Mouth/Throat: Oropharynx is clear and moist.  Multiple dental caries noted, dental caries noted over the left premolar with adjacent periapical abscess, no significant facial swelling  Eyes: Pupils are equal, round, and reactive to light.  Neck: Normal range of motion. Neck supple.   Cardiovascular: Normal rate and regular rhythm.   Pulmonary/Chest: Effort normal. No respiratory distress.  No stridor  Musculoskeletal: He exhibits no edema.  Lymphadenopathy:    He has no cervical adenopathy.  Neurological: He is alert and oriented to person, place, and time.  Skin: Skin is warm and dry.  Psychiatric: He has a normal mood and affect.    ED Course  Dental Date/Time: 04/01/2014 6:48 AM Performed by: Ross MarcusHORTON, COURTNEY, F Authorized by: Ross MarcusHORTON, COURTNEY, F Consent: Verbal consent obtained. Risks and benefits: risks, benefits and alternatives were discussed Consent given by: patient Patient identity confirmed: verbally with patient Local anesthesia used: yes Anesthesia: nerve block Local anesthetic: bupivacaine 0.5% with epinephrine Anesthetic total: 1.8 ml Patient sedated: no Patient tolerance: Patient tolerated the procedure well with no immediate complications. Comments: Inferior alveolar block and periapical block performed   (including critical care time) Labs Review Labs Reviewed - No data to display  Imaging Review No results found.   EKG Interpretation None      MDM   Final diagnoses:  Dental abscess    Patient presents with dental pain. Has obvious periapical abscess without significant facial swelling. No signs of deeper infection.  Patient is without signs of respiratory compromise.  Patient was given Toradol and Norco. She will be given penicillin, hydrocodone, and ibuprofen for pain relief at home. Referred to dentistry on call. Given strict return precautions.  After history, exam, and medical workup I feel the patient has been appropriately medically screened and is safe for discharge home. Pertinent diagnoses were discussed with the patient. Patient was given  return precautions.     Shon Batonourtney F Horton, MD 04/01/14 [phone]

## 2014-06-12 ENCOUNTER — Emergency Department (HOSPITAL_COMMUNITY): Payer: Self-pay

## 2014-06-12 ENCOUNTER — Encounter (HOSPITAL_COMMUNITY): Payer: Self-pay | Admitting: Emergency Medicine

## 2014-06-12 ENCOUNTER — Emergency Department (HOSPITAL_COMMUNITY)
Admission: EM | Admit: 2014-06-12 | Discharge: 2014-06-12 | Disposition: A | Discharge status: Home / Self Care | Payer: Self-pay | Attending: Emergency Medicine | Admitting: Emergency Medicine

## 2014-06-12 DIAGNOSIS — Y929 Unspecified place or not applicable: Secondary | ICD-10-CM | POA: Insufficient documentation

## 2014-06-12 DIAGNOSIS — Y939 Activity, unspecified: Secondary | ICD-10-CM | POA: Insufficient documentation

## 2014-06-12 DIAGNOSIS — F17200 Nicotine dependence, unspecified, uncomplicated: Secondary | ICD-10-CM | POA: Insufficient documentation

## 2014-06-12 DIAGNOSIS — S93402A Sprain of unspecified ligament of left ankle, initial encounter: Secondary | ICD-10-CM

## 2014-06-12 DIAGNOSIS — R296 Repeated falls: Secondary | ICD-10-CM | POA: Insufficient documentation

## 2014-06-12 DIAGNOSIS — S93409A Sprain of unspecified ligament of unspecified ankle, initial encounter: Secondary | ICD-10-CM | POA: Insufficient documentation

## 2014-06-12 DIAGNOSIS — Z791 Long term (current) use of non-steroidal anti-inflammatories (NSAID): Secondary | ICD-10-CM | POA: Insufficient documentation

## 2014-06-12 MED ORDER — IBUPROFEN 800 MG PO TABS: 800.0000 mg | ORAL_TABLET | Freq: Three times a day (TID) | ORAL | Status: DC

## 2014-06-12 MED ORDER — IBUPROFEN 400 MG PO TABS
800.0000 mg | ORAL_TABLET | Freq: Once | ORAL | Status: AC
  Administered 2014-06-12: 800 mg via ORAL
  Filled 2014-06-12: qty 4

## 2014-06-12 NOTE — ED Notes (Signed)
Pt presents to department for evaluation of L ankle pain. States he accidentally fell off porch. Now states pain and swelling. No obvious deformities noted. Pt is alert and oriented x4.

## 2014-06-12 NOTE — ED Notes (Signed)
Patient transported to X-ray via WC. 

## 2014-06-12 NOTE — ED Provider Notes (Signed)
Medical screening examination/treatment/procedure(s) were performed by non-physician practitioner and as supervising physician I was immediately available for consultation/collaboration.   EKG Interpretation None        [name], DO 06/12/14 1544

## 2014-06-12 NOTE — ED Notes (Signed)
Patient advises that he fell off a porch on Thursdays and twisted his left ankle. Rates pain 7/10. Slight edema noted to the left lateral ankle area. Sharp pain occurs with ambulation Positive PMS

## 2014-06-12 NOTE — Discharge Instructions (Signed)
Please follow up with your primary care physician in 1-2 days. If you do not have one [facility] listed [facility]. Please follow RICE method below. Please use motrin to help with swelling and discomfort. Please read all discharge instructions and return precautions.    Ankle Sprain An ankle sprain is an injury to the strong, fibrous tissues (ligaments) that hold the bones of your ankle joint together.  CAUSES An ankle sprain is usually caused by a fall or by twisting your ankle. Ankle sprains most commonly occur when you step on the outer edge of your foot, and your ankle turns inward. People who participate in sports are more prone to these types of injuries.  SYMPTOMS   Pain in your ankle. The pain may be present at rest or only when you are trying to stand or walk.  Swelling.  Bruising. Bruising may develop immediately or within 1 to 2 days after your injury.  Difficulty standing or walking, particularly when turning corners or changing directions. DIAGNOSIS  Your caregiver will ask you details about your injury and perform a physical exam of your ankle to determine if you have an ankle sprain. During the physical exam, your caregiver will press on and apply pressure to specific areas of your foot and ankle. Your caregiver will try to move your ankle in certain ways. An X-ray exam may be done to be sure a bone was not broken or a ligament did not separate from one of the bones in your ankle (avulsion fracture).  TREATMENT  Certain types of braces can help stabilize your ankle. Your caregiver can make a recommendation for this. Your caregiver may recommend the use of medicine for pain. If your sprain is severe, your caregiver may refer you to a surgeon who helps to restore function to parts of your skeletal system (orthopedist) or a physical therapist. HOME CARE INSTRUCTIONS   Apply ice to your injury for 1-2 days or as directed by your caregiver.  Applying ice helps to reduce inflammation and pain.  Put ice in a plastic bag.  Place a towel between your skin and the bag.  Leave the ice on for 15-20 minutes at a time, every 2 hours while you are awake.  Only take over-the-counter or prescription medicines for pain, discomfort, or fever as directed by your caregiver.  Elevate your injured ankle above the level of your heart as much as possible for 2-3 days.  If your caregiver recommends crutches, use them as instructed. Gradually put weight on the affected ankle. Continue to use crutches or a cane until you can walk without feeling pain in your ankle.  If you have a plaster splint, wear the splint as directed by your caregiver. Do not rest it on anything harder than a pillow for the first 24 hours. Do not put weight on it. Do not get it wet. You may take it off to take a shower or bath.  You may have been given an elastic bandage to wear around your ankle to provide support. If the elastic bandage is too tight (you have numbness or tingling in your foot or your foot becomes cold and blue), adjust the bandage to make it comfortable.  If you have an air splint, you may blow more air into it or let air out to make it more comfortable. You may take your splint off at night and before taking a shower or bath. Wiggle your toes in the splint  several times per day to decrease swelling. SEEK MEDICAL CARE IF:   You have rapidly increasing bruising or swelling.  Your toes feel extremely cold or you lose feeling in your foot.  Your pain is not relieved with medicine. SEEK IMMEDIATE MEDICAL CARE IF:  Your toes are numb or blue.  You have severe pain that is increasing. MAKE SURE YOU:   Understand these instructions.  Will watch your condition.  Will get help right away if you are not doing well or get worse. Document Released: 12/03/2005 Document Revised: 08/27/2012 Document Reviewed: 12/15/2011 [facility] Patient Information 2015  [place], [place]. This information is not intended to replace advice given to you by your health care provider. Make sure you discuss any questions you have with your health care provider. RICE: Routine Care for Injuries The routine care of many injuries includes Rest, Ice, Compression, and Elevation (RICE). HOME CARE INSTRUCTIONS  Rest is needed to allow your body to heal. Routine activities can usually be resumed when comfortable. Injured tendons and bones can take up to 6 weeks to heal. Tendons are the cord-like structures that attach muscle to bone.  Ice following an injury helps keep the swelling down and reduces pain.  Put ice in a plastic bag.  Place a towel between your skin and the bag.  Leave the ice on for 15-20 minutes, 3-4 times a day, or as directed by your health care provider. Do this while awake, for the first 24 to 48 hours. After that, continue as directed by your caregiver.  Compression helps keep swelling down. It also gives support and helps with discomfort. If an elastic bandage has been applied, it should be removed and reapplied every 3 to 4 hours. It should not be applied tightly, but firmly enough to keep swelling down. Watch fingers or toes for swelling, bluish discoloration, coldness, numbness, or excessive pain. If any of these problems occur, remove the bandage and reapply loosely. Contact your caregiver if these problems continue.  Elevation helps reduce swelling and decreases pain. With extremities, such as the arms, hands, legs, and feet, the injured area should be placed near or above the level of the heart, if possible. SEEK IMMEDIATE MEDICAL CARE IF:  You have persistent pain and swelling.  You develop redness, numbness, or unexpected weakness.  Your symptoms are getting worse rather than improving after several days. These symptoms may indicate that further evaluation or further X-rays are needed. Sometimes, X-rays may not show a small broken bone  (fracture) until 1 week or 10 days later. Make a follow-up appointment with your caregiver. Ask when your X-ray results will be ready. Make sure you get your X-ray results. Document Released: 03/17/2001 Document Revised: 12/08/2013 Document Reviewed: 05/04/2011 [facility] Patient Information 2015 [place], [place]. This information is not intended to replace advice given to you by your health care provider. Make sure you discuss any questions you have with your health care provider.

## 2014-06-12 NOTE — ED Notes (Signed)
Discharged using the teach back method. Demonstrates proper use of crutches. NAD noted at the time of discharged.

## 2014-06-12 NOTE — ED Provider Notes (Signed)
CSN: [id number]     Arrival date & time 06/12/14  [id number] History  This chart was scribed for non-physician practitioner, Francee PiccoloJennifer Piepenbrink working with [name], DO by Luisa DagoPriscilla Tutu, ED scribe. This patient was seen in room TR05C/TR05C and the patient's care was started at 10:43 AM.      Chief Complaint  Patient presents with  . Ankle Pain   The history is provided by the patient. No language interpreter was used.   HPI Comments: Cody Hughes is a 23 y.o. male who presents to the Emergency Department complaining of worsening left ankle pain that started approximately 2 days ago. Pt states that he accidentally fell off porch and rolled on his left ankle. He is now complaining of associated swelling and pain to the lateral portion of his foot. Denies any radiating pain. Pt describes the pain as "shooting". He states that the pain is worsened by walking and movement. Pt denies any tingling, numbness, head trauma or injury.    History reviewed. No pertinent past medical history. History reviewed. No pertinent past surgical history. History reviewed. No pertinent family history. History  Substance Use Topics  . Smoking status: Current Every Day Smoker -- 0.50 packs/day    Types: Cigarettes  . Smokeless tobacco: Not on file  . Alcohol Use: Yes     Comment: occassional    Review of Systems  Constitutional: Negative for fever and chills.  Musculoskeletal: Positive for arthralgias and joint swelling.  Neurological: Negative for numbness.  All other systems reviewed and are negative.     Allergies  Review of patient's allergies indicates no known allergies.  Home Medications   Prior to Admission medications   Medication Sig Start Date End Date Taking? Authorizing Provider  ibuprofen (ADVIL,MOTRIN) 800 MG tablet Take 1 tablet (800 mg total) by mouth 3 (three) times daily. 06/12/14   Jennifer L Piepenbrink, PA-C   Triage Vitals: BP 120/75  Pulse 95  Temp(Src) 97.8 F (36.6  C) (Oral)  Resp 18  SpO2 96%  Physical Exam  Nursing note and vitals reviewed. Constitutional: He is oriented to person, place, and time. He appears well-developed and well-nourished. No distress.  HENT:  Head: Normocephalic and atraumatic.  Right Ear: External ear normal.  Left Ear: External ear normal.  Nose: Nose normal.  Mouth/Throat: Oropharynx is clear and moist.  Eyes: Conjunctivae are normal.  Neck: Normal range of motion. Neck supple.  Cardiovascular: Normal rate, regular rhythm, normal heart sounds and intact distal pulses.   Pulmonary/Chest: Effort normal and breath sounds normal.  Abdominal: Soft.  Musculoskeletal:       Right ankle: Normal.       Left ankle: He exhibits decreased range of motion and swelling (mild). He exhibits no ecchymosis, no deformity, no laceration and normal pulse. Tenderness. Lateral malleolus tenderness found.       Right lower leg: Normal.       Left lower leg: Normal.       Right foot: Normal.       Left foot: Normal.  Neurological: He is alert and oriented to person, place, and time.  Skin: Skin is warm and dry. He is not diaphoretic.  Psychiatric: He has a normal mood and affect.    ED Course  Procedures (including critical care time) Medications  ibuprofen (ADVIL,MOTRIN) tablet 800 mg (800 mg Oral Given 06/12/14 1118)     DIAGNOSTIC STUDIES: Oxygen Saturation is 96% on RA, adequate by my interpretation.    COORDINATION OF CARE:  10:46 AM- Pt advised of plan for treatment and pt agrees.  Labs Review Labs Reviewed - No data to display  Imaging Review Dg Ankle Complete Left  06/12/2014   CLINICAL DATA:  Fall, lateral ankle pain, swelling  EXAM: LEFT ANKLE COMPLETE - 3+ VIEW  COMPARISON:  08/04/2013  FINDINGS: Three views of left ankle submitted. No acute fracture or subluxation. No radiopaque foreign body. Ankle mortise is preserved.  IMPRESSION: Negative.   Electronically Signed   By: Natasha MeadLiviu  Pop M.D.   On: 06/12/2014 10:39      EKG Interpretation None      MDM   Final diagnoses:  Left ankle sprain, initial encounter    Filed Vitals:   06/12/14 1110  BP: 116/72  Pulse: 86  Temp:   Resp:    Afebrile, NAD, non-toxic appearing, AAOx4.  Neurovascularly intact. Normal sensation. Imaging shows no fracture. Directed pt to ice injury, take acetaminophen or ibuprofen for pain, and to elevate and rest the injury when possible. Wrapped ankle with ace wrap. Applied ice. Crutches given. Return precautions discussed. Patient is agreeable to plan. Patient is stable at time of discharge    I personally performed the services described in this documentation, which was scribed in my presence. The recorded information has been reviewed and is accurate.    Jeannetta EllisJennifer L Piepenbrink, PA-C 06/12/14 1213

## 2014-06-12 NOTE — Progress Notes (Signed)
Orthopedic Tech Progress Note Patient Details:  Cody HeadlandJavonta Hughes 1991-02-05 [id number]  Ortho Devices Type of Ortho Device: Crutches;Ace wrap Ortho Device/Splint Interventions: Application   Cammer, Mickie BailJennifer Carol 06/12/2014, 11:16 AM

## 2015-09-25 ENCOUNTER — Emergency Department (HOSPITAL_COMMUNITY)
Admission: EM | Admit: 2015-09-25 | Discharge: 2015-09-25 | Disposition: A | Discharge status: Home / Self Care | Payer: Self-pay | Attending: Emergency Medicine | Admitting: Emergency Medicine

## 2015-09-25 ENCOUNTER — Encounter (HOSPITAL_COMMUNITY): Payer: Self-pay | Admitting: Emergency Medicine

## 2015-09-25 DIAGNOSIS — K529 Noninfective gastroenteritis and colitis, unspecified: Secondary | ICD-10-CM | POA: Insufficient documentation

## 2015-09-25 DIAGNOSIS — Z720 Tobacco use: Secondary | ICD-10-CM | POA: Insufficient documentation

## 2015-09-25 LAB — CBC WITH DIFFERENTIAL/PLATELET
BASOS PCT: 1 %
Basophils Absolute: 0 10*3/uL (ref 0.0–0.1)
Eosinophils Absolute: 0.2 10*3/uL (ref 0.0–0.7)
Eosinophils Relative: 6 %
HEMATOCRIT: 41.7 % (ref 39.0–52.0)
HEMOGLOBIN: 14 g/dL (ref 13.0–17.0)
LYMPHS PCT: 50 %
Lymphs Abs: 1.7 10*3/uL (ref 0.7–4.0)
MCH: 28.2 pg (ref 26.0–34.0)
MCHC: 33.6 g/dL (ref 30.0–36.0)
MCV: 83.9 fL (ref 78.0–100.0)
MONO ABS: 0.4 10*3/uL (ref 0.1–1.0)
MONOS PCT: 12 %
NEUTROS PCT: 31 %
Neutro Abs: 1 10*3/uL — ABNORMAL LOW (ref 1.7–7.7)
Platelets: 183 10*3/uL (ref 150–400)
RBC: 4.97 MIL/uL (ref 4.22–5.81)
RDW: 13.6 % (ref 11.5–15.5)
WBC: 3.3 10*3/uL — ABNORMAL LOW (ref 4.0–10.5)

## 2015-09-25 LAB — BASIC METABOLIC PANEL
ANION GAP: 4 — AB (ref 5–15)
BUN: 14 mg/dL (ref 6–20)
CHLORIDE: 108 mmol/L (ref 101–111)
CO2: 25 mmol/L (ref 22–32)
Calcium: 8.1 mg/dL — ABNORMAL LOW (ref 8.9–10.3)
Creatinine, Ser: 0.89 mg/dL (ref 0.61–1.24)
GFR calc non Af Amer: >60 mL/min (ref >60)
Glucose, Bld: 95 mg/dL (ref 65–99)
Potassium: 3.9 mmol/L (ref 3.5–5.1)
Sodium: 137 mmol/L (ref 135–145)

## 2015-09-25 MED ORDER — ONDANSETRON 4 MG PO TBDP: ORAL_TABLET | ORAL | Status: DC

## 2015-09-25 MED ORDER — SODIUM CHLORIDE 0.9 % IV BOLUS (SEPSIS)
1000.0000 mL | Freq: Once | INTRAVENOUS | Status: AC
  Administered 2015-09-25: 1000 mL via INTRAVENOUS

## 2015-09-25 MED ORDER — KETOROLAC TROMETHAMINE 30 MG/ML IJ SOLN
30.0000 mg | Freq: Once | INTRAMUSCULAR | Status: AC
  Administered 2015-09-25: 30 mg via INTRAVENOUS
  Filled 2015-09-25: qty 1

## 2015-09-25 MED ORDER — ONDANSETRON HCL 4 MG/2ML IJ SOLN
4.0000 mg | Freq: Once | INTRAMUSCULAR | Status: AC
  Administered 2015-09-25: 4 mg via INTRAVENOUS
  Filled 2015-09-25: qty 2

## 2015-09-25 MED ORDER — IBUPROFEN 800 MG PO TABS: ORAL_TABLET | ORAL | Status: DC

## 2015-09-25 NOTE — Discharge Instructions (Signed)
Drink plenty of fluids.   Follow up if not improving.  Tylenol for fever °

## 2015-09-25 NOTE — ED Provider Notes (Signed)
CSN: [id number]     Arrival date & time 09/25/15  0707 History   First MD Initiated Contact with Patient 09/25/15 [phone]     Chief Complaint  Patient presents with  . Diarrhea     (Consider location/radiation/quality/duration/timing/severity/associated sxs/prior Treatment) Patient is a 24 y.o. male presenting with diarrhea. The history is provided by the patient (The patient complains of diarrhea and abdominal cramping for a couple days. No blood in his diarrhea. He is also had some nausea).  Diarrhea Diarrhea characteristics: Liquidy stool no blood. Severity:  Moderate Onset quality:  Sudden Timing:  Constant Progression:  Unchanged Relieved by:  Nothing Worsened by:  Nothing tried Associated symptoms: no abdominal pain and no headaches     History reviewed. No pertinent past medical history. History reviewed. No pertinent past surgical history. History reviewed. No pertinent family history. Social History  Substance Use Topics  . Smoking status: Current Every Day Smoker -- 0.50 packs/day    Types: Cigarettes  . Smokeless tobacco: None  . Alcohol Use: Yes     Comment: occassional    Review of Systems  Constitutional: Negative for appetite change and fatigue.  HENT: Negative for congestion, ear discharge and sinus pressure.   Eyes: Negative for discharge.  Respiratory: Negative for cough.   Cardiovascular: Negative for chest pain.  Gastrointestinal: Positive for nausea and diarrhea. Negative for abdominal pain.  Genitourinary: Negative for frequency and hematuria.  Musculoskeletal: Negative for back pain.  Skin: Negative for rash.  Neurological: Negative for seizures and headaches.  Psychiatric/Behavioral: Negative for hallucinations.      Allergies  Review of patient's allergies indicates no known allergies.  Home Medications   Prior to Admission medications   Medication Sig Start Date End Date Taking? Authorizing Provider  ibuprofen (ADVIL,MOTRIN) 800 MG  tablet Take one every 8 hours for abdominal cramps of aches 09/25/15   Bethann Berkshire, MD  ondansetron (ZOFRAN ODT) 4 MG disintegrating tablet  ODT q4 hours prn nausea/vomit 09/25/15   Bethann Berkshire, MD   BP 124/50 mmHg  Pulse 67  Temp(Src) 97.9 F (36.6 C) (Oral)  Resp 18  SpO2 100% Physical Exam  Constitutional: He is oriented to person, place, and time. He appears well-developed.  HENT:  Head: Normocephalic.  Eyes: Conjunctivae and EOM are normal. No scleral icterus.  Neck: Neck supple. No thyromegaly present.  Cardiovascular: Normal rate and regular rhythm.  Exam reveals no gallop and no friction rub.   No murmur heard. Pulmonary/Chest: No stridor. He has no wheezes. He has no rales. He exhibits no tenderness.  Abdominal: He exhibits no distension. There is tenderness. There is no rebound.  Mild tenderness throughout  Musculoskeletal: Normal range of motion. He exhibits no edema.  Lymphadenopathy:    He has no cervical adenopathy.  Neurological: He is oriented to person, place, and time. He exhibits normal muscle tone. Coordination normal.  Skin: No rash noted. No erythema.  Psychiatric: He has a normal mood and affect. His behavior is normal.    ED Course  Procedures (including critical care time) Labs Review Labs Reviewed  CBC WITH DIFFERENTIAL/PLATELET - Abnormal; Notable for the following:    WBC 3.3 (*)    Neutro Abs 1.0 (*)    All other components within normal limits  BASIC METABOLIC PANEL - Abnormal; Notable for the following:    Calcium 8.1 (*)    Anion gap 4 (*)    All other components within normal limits    Imaging Review No results found.  I have personally reviewed and evaluated these images and lab results as part of my medical decision-making.   EKG Interpretation None      MDM   Final diagnoses:  Gastroenteritis    Patient improves with nausea medicine Toradol and fluids. Labs unremarkable. Suspect gastroenteritis. Have prescribed Motrin  and Zofran.    Bethann Berkshire, MD 09/25/15 1122

## 2015-09-25 NOTE — ED Notes (Signed)
Pt C/O diarrhea x 2days. Pt states other individuals at pts job has been sick with same symptoms.

## 2015-09-30 ENCOUNTER — Encounter (HOSPITAL_COMMUNITY): Payer: Self-pay | Admitting: Emergency Medicine

## 2015-09-30 ENCOUNTER — Emergency Department (HOSPITAL_COMMUNITY)
Admission: EM | Admit: 2015-09-30 | Discharge: 2015-09-30 | Disposition: A | Discharge status: Home / Self Care | Payer: No Typology Code available for payment source | Attending: Emergency Medicine | Admitting: Emergency Medicine

## 2015-09-30 DIAGNOSIS — Z720 Tobacco use: Secondary | ICD-10-CM | POA: Insufficient documentation

## 2015-09-30 DIAGNOSIS — R110 Nausea: Secondary | ICD-10-CM | POA: Diagnosis not present

## 2015-09-30 LAB — CBC WITH DIFFERENTIAL/PLATELET
Basophils Absolute: 0 10*3/uL (ref 0.0–0.1)
Basophils Relative: 0 %
Eosinophils Absolute: 0.1 10*3/uL (ref 0.0–0.7)
Eosinophils Relative: 3 %
HCT: 45.6 % (ref 39.0–52.0)
HEMOGLOBIN: 15.5 g/dL (ref 13.0–17.0)
Lymphocytes Relative: 36 %
Lymphs Abs: 1.9 10*3/uL (ref 0.7–4.0)
MCH: 28.5 pg (ref 26.0–34.0)
MCHC: 34 g/dL (ref 30.0–36.0)
MCV: 84 fL (ref 78.0–100.0)
Monocytes Absolute: 0.5 10*3/uL (ref 0.1–1.0)
Monocytes Relative: 9 %
NEUTROS PCT: 52 %
Neutro Abs: 2.8 10*3/uL (ref 1.7–7.7)
Platelets: 202 10*3/uL (ref 150–400)
RBC: 5.43 MIL/uL (ref 4.22–5.81)
RDW: 13.8 % (ref 11.5–15.5)
WBC: 5.3 10*3/uL (ref 4.0–10.5)

## 2015-09-30 LAB — COMPREHENSIVE METABOLIC PANEL
ALK PHOS: 54 U/L (ref 38–126)
ALT: 14 U/L — ABNORMAL LOW (ref 17–63)
ANION GAP: 6 (ref 5–15)
AST: 20 U/L (ref 15–41)
Albumin: 3.7 g/dL (ref 3.5–5.0)
BUN: 13 mg/dL (ref 6–20)
CALCIUM: 8.6 mg/dL — AB (ref 8.9–10.3)
CO2: 24 mmol/L (ref 22–32)
Chloride: 110 mmol/L (ref 101–111)
Creatinine, Ser: 1 mg/dL (ref 0.61–1.24)
GFR calc non Af Amer: >60 mL/min (ref >60)
GLUCOSE: 96 mg/dL (ref 65–99)
Potassium: 3.8 mmol/L (ref 3.5–5.1)
Sodium: 140 mmol/L (ref 135–145)
Total Bilirubin: 0.5 mg/dL (ref 0.3–1.2)
Total Protein: 6 g/dL — ABNORMAL LOW (ref 6.5–8.1)

## 2015-09-30 LAB — LIPASE, BLOOD: Lipase: 27 U/L (ref 22–51)

## 2015-09-30 MED ORDER — SODIUM CHLORIDE 0.9 % IV BOLUS (SEPSIS)
1000.0000 mL | Freq: Once | INTRAVENOUS | Status: AC
  Administered 2015-09-30: 1000 mL via INTRAVENOUS

## 2015-09-30 MED ORDER — ONDANSETRON HCL 4 MG/2ML IJ SOLN
4.0000 mg | INTRAMUSCULAR | Status: DC | PRN
  Administered 2015-09-30: 4 mg via INTRAVENOUS
  Filled 2015-09-30: qty 2

## 2015-09-30 MED ORDER — ONDANSETRON HCL 4 MG PO TABS: 4.0000 mg | ORAL_TABLET | Freq: Three times a day (TID) | ORAL | Status: DC | PRN

## 2015-09-30 NOTE — ED Notes (Signed)
Patient tolerated PO fluids

## 2015-09-30 NOTE — ED Notes (Signed)
Patient verbalizes understanding of discharge instructions, prescription medications, home care and follow up care. Patient ambulatory out of department at this time with family. 

## 2015-09-30 NOTE — ED Notes (Signed)
Pt states that he was sick a few days ago with stomach virus and was feeling better. Today donated plasma and is feeling nauseated again.

## 2015-09-30 NOTE — Discharge Instructions (Signed)
°Emergency Department Resource Guide °1) Find a Doctor and Pay Out of Pocket °Although you won't have to find out who is covered by your insurance plan, it is a good idea to ask around and get recommendations. You will then need to call the office and see if the doctor you have chosen will accept you as a new patient and what types of options they offer for patients who are self-pay. Some doctors offer discounts or will set up payment plans for their patients who do not have insurance, but you will need to ask so you aren't surprised when you get to your appointment. ° °2) Contact Your Local Health Department °Not all health departments have doctors that can see patients for sick visits, but many do, so it is worth a call to see if yours does. If you don't know where your local health department is, you can check in your phone book. The CDC also has a tool to help you locate your state's health department, and many state websites also have listings of all of their local health departments. ° °3) Find a Walk-in Clinic °If your illness is not likely to be very severe or complicated, you may want to try a walk in clinic. These are popping up all over the country in pharmacies, drugstores, and shopping centers. They're usually staffed by nurse practitioners or physician assistants that have been trained to treat common illnesses and complaints. They're usually fairly quick and inexpensive. However, if you have serious medical issues or chronic medical problems, these are probably not your best option. ° °No Primary Care Doctor: °- Call Health Connect at  832-8000 - they can help you locate a primary care doctor that  accepts your insurance, provides certain services, etc. °- Physician Referral Service- 1-800-533-3463 ° °Chronic Pain Problems: °Organization         Address  Phone   Notes  °[facility]  ([phone]) 297-2271 Patients need to be referred by their primary care doctor.  ° °Medication  Assistance: °Organization         Address  Phone   Notes  °[place] [facility] [address] °[place], [place] [address] (336) 641-8030 --Must be a resident of [place] °-- Must have NO insurance coverage whatsoever (no Medicaid/ Medicare, etc.) °-- The pt. MUST have a primary care doctor that directs their care regularly and follows them in the community °  °[facility]  (866) 331-1348   °[employer]  (888) 892-1162   ° °Agencies that provide inexpensive medical care: °Organization         Address  Phone   Notes  °[facility]  (336) 832-8035   °[facility]    (336) 832-7272   °[facility] [address] °[place], [place] [address] (336) 832-4777   °[facility] [address], °[place] (336) 271-4999   °[facility]    (336) 373-0678   °[facility]    (336) 272-1050   °[facility] ° [address], [place] Phone:  (336) 832-4444, Fax:  (336) 832-4440 Hours of Operation:  9 am - 6 pm, M-F.  Also accepts Medicaid/Medicare and self-pay.  °[facility] ° [address], [place] Phone: (336) 832-3150, Fax: (336) [phone]. Hours of Operation:  8:30 am - 5:30 pm, M-F.  Also accepts Medicaid and self-pay.  °[facility] [place] [address], [place] Phone: (336) 878-6027   °[facility] [address], [place], [place] (336)723-1848, Ext. 123 Mondays & Thursdays: 7-9 AM.  First 15 patients are seen on a first come, first serve basis. °  ° °Medicaid-accepting [place] Providers: ° °Organization         Address  Phone   Notes  °[facility] [address], [place] (336) 641-2100 Also accepts self-pay patients.  °[facility] [address], [place] ° (336) 856-9996   °[facility] [address], [place]  (336) 288-8857   °[facility] [address] [place] [address], [place] (336) 299-7000   °Veita Bland [address], [place]  ° (336) 373-1557 Only accepts [place] Access Medicaid patients after they have their name applied to their card.  ° °Self-Pay (no insurance) in [place]: ° °Organization         Address  Phone   Notes  °Sickle Cell Patients, [facility] [address], [place] (336) 832-1970   °[facility] [address], [place] (336) 832-4400   °[facility] ° [address], [place] (336) 992-4800   °[facility]/Dr. Osei-Bonsu ° [address] [place] [address], [place] or [address], [place] (336) [phone] Phone number for both [place] and [place] locations is the same.  °[facility] [address], [place] (336) 299-0000   °[facility] [address] [place] [address], [place] or [address] (336) 852-7530 °(336) 878-2260   °[facility] [address], [place] (336) 350-1642, phone; (336) 294-5005, fax Sees patients 1st and 3rd Saturday of every month.  Must not qualify for public or private insurance (i.e. Medicaid, Medicare, [place] Health Choice, Veterans' Benefits) • Household income should be no more than 200% of the poverty level •The clinic cannot treat you if you are pregnant or think you are pregnant • Sexually transmitted diseases are not treated at the clinic.  ° ° °Dental Care: °Organization         Address  Phone  Notes  °[place] [facility] [address], [place] (336) 641-6152 Accepts children up to age 21 who are enrolled in Medicaid or [place] Health Choice; pregnant women with a Medicaid card; and children who have applied for Medicaid or [place] Health Choice, but were declined, whose parents can pay a reduced fee at time of service.  °[place]  [facility] [place]  [address], [place] (336) 641-7733 Accepts children up to age 21 who are enrolled in Medicaid or [place] Health Choice; pregnant women with a Medicaid card; and children who have applied for Medicaid or [place] Health Choice, but were declined, whose parents can pay a reduced fee at time of service.  °[facility] ° [address], [place] (336) 641-4533 Patients are seen by appointment only. Walk-ins are not accepted. [facility] will see patients 18 years of age and older. °Monday - Tuesday (8am-5pm) °Most Wednesdays (8:30-5pm) °$30 per visit, cash only  °[facility] ° [address], [place] (336) 641-4533 Patients are seen by appointment only. Walk-ins are not accepted. [facility] will see patients 18 years of age and older. °One   Wednesday Evening (Monthly: Volunteer Based).  $30 per visit, cash only  °[facility]  (919) 537-3737 for adults; Children under age 4, call Graduate Pediatric Dentistry at (919) [phone]. Children aged 4-14, please call (919) [phone] to request a pediatric application. ° Dental services are provided in all areas of dental care including fillings, crowns and bridges, complete and partial dentures, implants, gum treatment, root canals, and extractions. Preventive care is also provided. Treatment is provided to both adults and children. °Patients are selected via a lottery and there is often a waiting list. °  °[facility] [address], °[place] ° (336) 763-8833 [email or web address] °  °[facility] [address], [place], [place] (336)723-1848, Ext. 123 Second and Fourth Thursday of each month, opens at 6:30 AM; Clinic ends at 9 AM.  Patients are seen on a first-come first-served basis, and a limited number are seen during each clinic.  ° °[facility] ° [address], [place], [place] (336) 723-7904    Eligibility Requirements °You must have lived in [place], [place], or [place] counties for at least the last three months. °  You cannot be eligible for state or federal sponsored healthcare insurance, including Veterans Administration, Medicaid, or Medicare. °  You generally cannot be eligible for healthcare insurance through your employer.  °  How to apply: °Eligibility screenings are held every Tuesday and Wednesday afternoon from 1:00 pm until 4:00 pm. You do not need an appointment for the interview!  °[facility] [address], [place], [place] 336-631-2330   °[facility]  336-342-8273   °[facility]  336-703-3100   °[facility]  336-570-6415   ° °Behavioral Health Resources in the Community: °Intensive Outpatient Programs °Organization         Address  Phone  Notes  °[facility] [address], [place], [place] 336-878-6098   °[facility] [address], [place], [place] 336-832-9800   °[facility] [address], [place], [place] ° 336-882-2125   °[facility] [address],  °[place], [place] 1-800-853-5163 or 336-641-4981   °Substance Abuse Resources °Organization         Address  Phone  Notes  °[facility]  336-882-2125   °[facility]  336-784-9470   °[facility]  336-285-9073   °[facility]  336-845-3988   °Residential & Outpatient Substance Abuse Program  1-800-659-3381   °Psychological Services °Organization         Address  Phone  Notes  °[facility]  336- 832-9600   °[facility]  336- 378-7881   °[facility] [address], [place] 1-800-853-5163 or 336-641-4981   ° °Mobile Crisis Teams °Organization         Address  Phone  Notes  °[facility], Mobile Crisis Care Unit  1-877-626-1772   °Assertive °[facility] ° [address]  [place], [place] 336-834-9664   °Sharon DeEsch [address] °[place] 336-554-5454   ° °Self-Help/Support Groups °Organization         Address  Phone             Notes  °[facility] of  - variety of support groups  336- 373-1402 Call for more information  °Narcotics Anonymous (NA), [facility] [address], °[place]  2 meetings at this location  ° °  Residential Treatment Programs Organization         Address  Phone  Notes  [facility] [address],    [place]  [phone]   [facility]  [address], [place] [phone], [place], [place] [phone]([phone]) [phone]   [facility] [address], [place] [phone] Admissions: 8am-3pm M-F  [facility] [address]    [place], [place] [phone]   [facility] [address], [place], [place] [phone]   [facility] [address]  [place], [place] [phone]) [phone]   [facility] Intensive Outpatient [address], [place], [place] [phone]) [phone]   [facility] ([facility] [address]  [place], [place] [phone] or [phone]   [facility] [address] [place], [place] [phone] Accepts Medicaid  [facility] [address]  [place] [phone] Substance Abuse/Addiction Treatment   [facility] Resources Organization         Address  Phone  Notes  [facility]  629-833-9656(888) [phone]   Angie FavaJulie Brannon, PhD [address], [address] [place], [place]   [phone](336) [phone] or [phone](336) [phone]   [facility]   [address] [place], [place] [phone](336) [phone]   [facility] [address], [place], [place] ([phone](336) [phone] Insurance/Medicaid/sponsorship through [facility] [address]                                    [place], [place] [phone](336) [phone] Therapy/tele-psych/case    [facility] [address]   [place], [place] [phone]) [phone]    Dr. Lolly MustacheArfeen  [phone](336) [phone]   [facility] [place]  [employer] [facility] 1) [address], [place] 2) [address], [place] 3)  [address], [place] [phone](336) [phone](336) [phone]  [phone](336) [phone]   [facility] Child Abuse Hotline 505-095-3885(336) (442)099-9496 or [phone](336) [phone] (After Hours)      Take the prescription as directed.  Increase your fluid intake (ie:  Gatoraide) for the next few days.  Eat a bland diet and advance to your regular diet slowly as you can tolerate it.  Call your regular medical doctor Monday to schedule a follow up appointment in the next 3 days.  Return to the Emergency Department immediately if not improving (or even worsening) despite taking the medicines as prescribed, any black or bloody stool or vomit, if you develop a fever over "101," or for any other concerns.
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
## 2015-09-30 NOTE — ED Notes (Signed)
Patient given sprite to drink per order.  

## 2015-10-22 ENCOUNTER — Emergency Department (HOSPITAL_COMMUNITY): Payer: No Typology Code available for payment source

## 2015-10-22 ENCOUNTER — Emergency Department (HOSPITAL_COMMUNITY)
Admission: EM | Admit: 2015-10-22 | Discharge: 2015-10-22 | Disposition: A | Discharge status: Home / Self Care | Payer: No Typology Code available for payment source | Attending: Emergency Medicine | Admitting: Emergency Medicine

## 2015-10-22 ENCOUNTER — Encounter (HOSPITAL_COMMUNITY): Payer: Self-pay | Admitting: *Deleted

## 2015-10-22 DIAGNOSIS — Z720 Tobacco use: Secondary | ICD-10-CM | POA: Diagnosis not present

## 2015-10-22 DIAGNOSIS — S161XXA Strain of muscle, fascia and tendon at neck level, initial encounter: Secondary | ICD-10-CM | POA: Diagnosis not present

## 2015-10-22 DIAGNOSIS — Y998 Other external cause status: Secondary | ICD-10-CM | POA: Diagnosis not present

## 2015-10-22 DIAGNOSIS — S39012A Strain of muscle, fascia and tendon of lower back, initial encounter: Secondary | ICD-10-CM | POA: Diagnosis not present

## 2015-10-22 DIAGNOSIS — Y92410 Unspecified street and highway as the place of occurrence of the external cause: Secondary | ICD-10-CM | POA: Diagnosis not present

## 2015-10-22 DIAGNOSIS — S199XXA Unspecified injury of neck, initial encounter: Secondary | ICD-10-CM | POA: Diagnosis present

## 2015-10-22 DIAGNOSIS — Y9389 Activity, other specified: Secondary | ICD-10-CM | POA: Insufficient documentation

## 2015-10-22 MED ORDER — NAPROXEN 500 MG PO TABS: ORAL_TABLET | ORAL | Status: DC
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
## 2015-10-22 NOTE — Discharge Instructions (Signed)
Follow up if not improving
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
## 2015-11-05 ENCOUNTER — Emergency Department (HOSPITAL_COMMUNITY)
Admission: EM | Admit: 2015-11-05 | Discharge: 2015-11-05 | Disposition: A | Discharge status: Home / Self Care | Payer: No Typology Code available for payment source | Attending: Emergency Medicine | Admitting: Emergency Medicine

## 2015-11-05 ENCOUNTER — Encounter (HOSPITAL_COMMUNITY): Payer: Self-pay | Admitting: *Deleted

## 2015-11-05 DIAGNOSIS — F17210 Nicotine dependence, cigarettes, uncomplicated: Secondary | ICD-10-CM | POA: Diagnosis not present

## 2015-11-05 DIAGNOSIS — Y92410 Unspecified street and highway as the place of occurrence of the external cause: Secondary | ICD-10-CM | POA: Insufficient documentation

## 2015-11-05 DIAGNOSIS — Y939 Activity, unspecified: Secondary | ICD-10-CM | POA: Diagnosis not present

## 2015-11-05 DIAGNOSIS — Y998 Other external cause status: Secondary | ICD-10-CM | POA: Insufficient documentation

## 2015-11-05 DIAGNOSIS — S3992XA Unspecified injury of lower back, initial encounter: Secondary | ICD-10-CM | POA: Diagnosis present

## 2015-11-05 DIAGNOSIS — S39012A Strain of muscle, fascia and tendon of lower back, initial encounter: Secondary | ICD-10-CM

## 2015-11-05 MED ORDER — KETOROLAC TROMETHAMINE 30 MG/ML IJ SOLN
30.0000 mg | Freq: Once | INTRAMUSCULAR | Status: AC
  Administered 2015-11-05: 30 mg via INTRAMUSCULAR
  Filled 2015-11-05: qty 1

## 2015-11-05 MED ORDER — DEXAMETHASONE 4 MG PO TABS
12.0000 mg | ORAL_TABLET | Freq: Once | ORAL | Status: AC
  Administered 2015-11-05: 12 mg via ORAL
  Filled 2015-11-05: qty 3
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
## 2015-11-05 NOTE — Discharge Instructions (Signed)
Back Exercises °The following exercises strengthen the muscles that help to support the back. They also help to keep the lower back flexible. Doing these exercises can help to prevent back pain or lessen existing pain. °If you have back pain or discomfort, try doing these exercises 2-3 times each day or as told by your health care provider. When the pain goes away, do them once each day, but increase the number of times that you repeat the steps for each exercise (do more repetitions). If you do not have back pain or discomfort, do these exercises once each day or as told by your health care provider. °EXERCISES °Single Knee to Chest °Repeat these steps 3-5 times for each leg: °1. Lie on your back on a firm bed or the floor with your legs extended. °2. Bring one knee to your chest. Your other leg should stay extended and in contact with the floor. °3. Hold your knee in place by grabbing your knee or thigh. °4. Pull on your knee until you feel a gentle stretch in your lower back. °5. Hold the stretch for 10-30 seconds. °6. Slowly release and straighten your leg. °Pelvic Tilt °Repeat these steps 5-10 times: °1. Lie on your back on a firm bed or the floor with your legs extended. °2. Bend your knees so they are pointing toward the ceiling and your feet are flat on the floor. °3. Tighten your lower abdominal muscles to press your lower back against the floor. This motion will tilt your pelvis so your tailbone points up toward the ceiling instead of pointing to your feet or the floor. °4. With gentle tension and even breathing, hold this position for 5-10 seconds. °Cat-Cow °Repeat these steps until your lower back becomes more flexible: °1. Get into a hands-and-knees position on a firm surface. Keep your hands under your shoulders, and keep your knees under your hips. You may place padding under your knees for comfort. °2. Let your head hang down, and point your tailbone toward the floor so your lower back becomes  rounded like the back of a cat. °3. Hold this position for 5 seconds. °4. Slowly lift your head and point your tailbone up toward the ceiling so your back forms a sagging arch like the back of a cow. °5. Hold this position for 5 seconds. °Press-Ups °Repeat these steps 5-10 times: °1. Lie on your abdomen (face-down) on the floor. °2. Place your palms near your head, about shoulder-width apart. °3. While you keep your back as relaxed as possible and keep your hips on the floor, slowly straighten your arms to raise the top half of your body and lift your shoulders. Do not use your back muscles to raise your upper torso. You may adjust the placement of your hands to make yourself more comfortable. °4. Hold this position for 5 seconds while you keep your back relaxed. °5. Slowly return to lying flat on the floor. °Bridges °Repeat these steps 10 times: °1. Lie on your back on a firm surface. °2. Bend your knees so they are pointing toward the ceiling and your feet are flat on the floor. °3. Tighten your buttocks muscles and lift your buttocks off of the floor until your waist is at almost the same height as your knees. You should feel the muscles working in your buttocks and the back of your thighs. If you do not feel these muscles, slide your feet 1-2 inches farther away from your buttocks. °4. Hold this position for 3-5   seconds. °5. Slowly lower your hips to the starting position, and allow your buttocks muscles to relax completely. °If this exercise is too easy, try doing it with your arms crossed over your chest. °Abdominal Crunches °Repeat these steps 5-10 times: °1. Lie on your back on a firm bed or the floor with your legs extended. °2. Bend your knees so they are pointing toward the ceiling and your feet are flat on the floor. °3. Cross your arms over your chest. °4. Tip your chin slightly toward your chest without bending your neck. °5. Tighten your abdominal muscles and slowly raise your trunk (torso) high  enough to lift your shoulder blades a tiny bit off of the floor. Avoid raising your torso higher than that, because it can put too much stress on your low back and it does not help to strengthen your abdominal muscles. °6. Slowly return to your starting position. °Back Lifts °Repeat these steps 5-10 times: °1. Lie on your abdomen (face-down) with your arms at your sides, and rest your forehead on the floor. °2. Tighten the muscles in your legs and your buttocks. °3. Slowly lift your chest off of the floor while you keep your hips pressed to the floor. Keep the back of your head in line with the curve in your back. Your eyes should be looking at the floor. °4. Hold this position for 3-5 seconds. °5. Slowly return to your starting position. °SEEK MEDICAL CARE IF: °· Your back pain or discomfort gets much worse when you do an exercise. °· Your back pain or discomfort does not lessen within 2 hours after you exercise. °If you have any of these problems, stop doing these exercises right away. Do not do them again unless your health care provider says that you can. °SEEK IMMEDIATE MEDICAL CARE IF: °· You develop sudden, severe back pain. If this happens, stop doing the exercises right away. Do not do them again unless your health care provider says that you can. °  °This information is not intended to replace advice given to you by your health care provider. Make sure you discuss any questions you have with your health care provider. °  °Document Released: 01/10/2005 Document Revised: 08/24/2015 Document Reviewed: 01/27/2015 °Elsevier Interactive Patient Education ©2016 Elsevier Inc[employer] ° °Emergency Department Resource Guide °1) Find a Doctor and Pay Out of Pocket °Although you won't have to find out who is covered by your insurance plan, it is a good idea to ask around and get recommendations. You will then need to call the office and see if the doctor you have chosen will accept you as a new patient and what types of  options they offer for patients who are self-pay. Some doctors offer discounts or will set up payment plans for their patients who do not have insurance, but you will need to ask so you aren't surprised when you get to your appointment. ° °2) Contact Your Local Health Department °Not all health departments have doctors that can see patients for sick visits, but many do, so it is worth a call to see if yours does. If you don't know where your local health department is, you can check in your phone book. The CDC also has a tool to help you locate your state's health department, and many state websites also have listings of all of their local health departments. ° °3) Find a Walk-in Clinic °If your illness is not likely to be very severe or complicated, you may want to   try a walk in clinic. These are popping up all over the country in pharmacies, drugstores, and shopping centers. They're usually staffed by nurse practitioners or physician assistants that have been trained to treat common illnesses and complaints. They're usually fairly quick and inexpensive. However, if you have serious medical issues or chronic medical problems, these are probably not your best option. ° °No Primary Care Doctor: °- Call [facility] at  832-8000 - they can help you locate a primary care doctor that  accepts your insurance, provides certain services, etc. °- Physician Referral Service- 1-800-533-3463 ° °Chronic Pain Problems: °Organization         Address  Phone   Notes  °[facility]  (336) 297-2271 Patients need to be referred by their primary care doctor.  ° °Medication Assistance: °Organization         Address  Phone   Notes  °[place] [facility] [address] °[place], [place] [address] (336) 641-8030 --Must be a resident of [place] °-- Must have NO insurance coverage whatsoever (no Medicaid/ Medicare, etc.) °-- The pt. MUST have a primary care doctor that directs  their care regularly and follows them in the community °  °[facility]  (866) 331-1348   °[employer]  (888) 892-1162   ° °Agencies that provide inexpensive medical care: °Organization         Address  Phone   Notes  °[facility]  (336) 832-8035   °[facility]    (336) 832-7272   °[facility] [address] °[place], [place] [address] (336) 832-4777   °[facility] [address], °[place] (336) 271-4999   °[facility]    (336) 373-0678   °[facility]    (336) 272-1050   °[facility] ° [address], [place] Phone:  (336) 832-4444, Fax:  (336) 832-4440 Hours of Operation:  9 am - 6 pm, M-F.  Also accepts Medicaid/Medicare and self-pay.  °[facility] ° [address], [place] Phone: (336) 832-3150, Fax: (336) 832-3151. Hours of Operation:  8:30 am - 5:30 pm, M-F.  Also accepts Medicaid and self-pay.  °[facility] [place] [address], [place] Phone: (336) 878-6027   °[facility] [address], [place], [place] (336)723-1848, Ext. 123 Mondays & Thursdays: 7-9 AM.  First 15 patients are seen on a first come, first serve basis. °  ° °Medicaid-accepting [place] Providers: ° °Organization         Address  Phone   Notes  °[facility] [address], [place] (336) 641-2100 Also accepts self-pay patients.  °[facility] [address], [place] ° (336) 856-9996   °[facility] [address], [place] (336) 288-8857   °[facility] [address] [place] [address], [place] (336) 299-7000   °Veita Bland [address], [place]  ° (336) 373-1557 Only accepts [place] Access Medicaid patients after they have their name applied to their card.  ° °Self-Pay (no insurance) in [place]: ° °Organization          Address  Phone   Notes  °Sickle Cell Patients, [facility] [address], [place] (336) 832-1970   ° [facility] [address], [place] (336) 832-4400   °  [facility] ° [address], [place] (336) 992-4800   °[facility]/Dr. Osei-Bonsu ° [address] [place] [address], [place] or [address], [place] (336) 841-8500 Phone number for both [place] and [place] locations is the same.  °[facility] [address], [place] (336) 299-0000   °[facility] [address] [place] [address], [place] or [address] (336) 852-7530 °(336) 878-2260   °[facility] [address], [place] (336) 350-1642, phone; (336) 294-5005, fax Sees patients 1st and 3rd Saturday of every month.  Must not qualify for public or private insurance (i.e. Medicaid, Medicare, [place] Health Choice, Veterans' Benefits) • Household income should be no more than 200% of the poverty level •The clinic cannot treat you if you are pregnant or think you are pregnant • Sexually transmitted diseases are not treated at the clinic.  ° ° °Dental Care: °Organization         Address  Phone  Notes  °[facility] [address], [place] (336) 641-6152 Accepts children up to age 21 who are enrolled in Medicaid or [place] Health Choice; pregnant women with a Medicaid card; and children who have applied for Medicaid or [place] Health Choice, but were declined, whose parents can pay a reduced fee at time of service.  °[facility] [place]  [address], [place] (336) 641-7733 Accepts children up to age 21 who are enrolled in Medicaid or [place] Health Choice; pregnant women with a Medicaid card; and children who have applied for Medicaid or [place] Health Choice, but were declined, whose parents can pay a reduced fee at time of  service.  °[facility] ° [address], [place] (336) 641-4533 Patients are seen by appointment only. Walk-ins are not accepted. [facility] will see patients 18 years of age and older. °Monday - Tuesday (8am-5pm) °Most Wednesdays (8:30-5pm) °$30 per visit, cash only  °[facility] ° [address], [place] (336) 641-4533 Patients are seen by appointment only. Walk-ins are not accepted. [facility] will see patients 18 years of age and older. °One Wednesday Evening (Monthly: Volunteer Based).  $30 per visit, cash only  °[facility]  (919) 537-3737 for adults; Children under age 4, call Graduate Pediatric Dentistry at (919) [phone]. Children aged 4-14, please call (919) [phone] to request a pediatric application. ° Dental services are provided in all areas of dental care including fillings, crowns and bridges, complete and partial dentures, implants, gum treatment, root canals, and extractions. Preventive care is also provided. Treatment is provided to both adults and children. °Patients are selected via a lottery and there is often a waiting list. °  °[facility] [address], ° ° (336) 763-8833 [email or web address] °  °[facility] [address], [place], [place] (336)723-1848, Ext. 123 Second and Fourth Thursday of each month, opens at 6:30 AM; Clinic ends at 9 AM.  Patients are seen on a first-come first-served basis, and a limited number are seen during each clinic.  ° °[facility] ° [address], [place], [place] (336) 723-7904   Eligibility Requirements °You must have lived in [place], [place], or [place] counties for at least the last three months. °  You cannot be eligible for state or federal sponsored healthcare   insurance, including Veterans Administration, Medicaid, or Medicare. °  You generally cannot be eligible for healthcare insurance through your employer.   °  How to apply: °Eligibility screenings are held every Tuesday and Wednesday afternoon from 1:00 pm until 4:00 pm. You do not need an appointment for the interview!  °[facility] [address], [place], [place] 336-631-2330   °[facility]  336-342-8273   °[facility]  336-703-3100   °[facility]  336-570-6415   ° °Behavioral Health Resources in the Community: °Intensive Outpatient Programs °Organization         Address  Phone  Notes  °[facility] [address], [place], [place] 336-878-6098   °[facility] [address], [place], [place] 336-832-9800   °[facility] [address], [place], [place] ° 336-882-2125   °[facility] [address],  °[place], [place] 1-800-853-5163 or 336-641-4981   °Substance Abuse Resources °Organization         Address  Phone  Notes  °[facility]  336-882-2125   °[facility]  336-784-9470   °[facility]  336-285-9073   °[facility]  336-845-3988   °Residential & Outpatient Substance Abuse Program  1-800-659-3381   °Psychological Services °Organization         Address  Phone  Notes  °[facility]  336- 832-9600   °[facility]  336- 378-7881   °[facility] [address], [place] 1-800-853-5163 or 336-641-4981   ° °Mobile Crisis Teams °Organization         Address  Phone  Notes  °[facility], Mobile Crisis Care Unit  1-877-626-1772   °Assertive °[facility] ° [address] [place], [place] 336-834-9664   °Sharon DeEsch [address] °[place] 336-554-5454   ° °Self-Help/Support Groups °Organization         Address  Phone             Notes  °[facility] - variety of support groups  336- 373-1402 Call for more information  °Narcotics Anonymous (NA), [facility] [address], °[place]  2 meetings at this location  ° °Residential Treatment Programs °Organization         Address  Phone  Notes  °[facility] [address],    °[place]  1-866-801-8205   °[facility] ° [address], [place], [place] 704-293-8524   °[facility] [address], [place] 336-845-3988 Admissions: 8am-3pm M-F  °[facility] [address]    °[place], [place] 336-841-1104   °[facility] [address], [place], [place] 336-379-7146   °[facility] [address]  °[place], [place] 336-285-9073   °[facility] - Intensive Outpatient [address], [place], [place] 336-852-3033   °[facility] [address]  °[place], [place] 1-877-615-2722 or 336-784-9470   °[facility] [address] [place], [place] 336-227-7417 Accepts Medicaid  °[facility] [address]  °[place] 1-800-659-3381 Substance Abuse/Addiction Treatment  ° °[place] Behavioral Health Resources °Organization         Address  Phone  Notes  °[facility]  (888) 581-9988   °Julie Brannon, PhD [address] [place],    (336) 349-5553 or (  336) 951-0000   ° Behavioral   [address] °[place], [place] (336) 349-4454   °[facility] [address], [place], [place] (336) 342-8316 Insurance/Medicaid/sponsorship through [facility]  °[facility] [address]                                    [place], [place] (336) 342-8316 Therapy/tele-psych/case  °[facility] [address]  ° [place], [place] (336) 349-2233    °Dr. Arfeen  (336) 349-4544   °[facility] [place]  [employer] [place] [facility] 1) [address], [place] °2) [address], [place] °3)  [address], [place] (336) 349-3220 °(336) 342-7768 ° °(336) 342-8140   °[place] [facility]  [facility] (336) 342-1394 or (336) 342-3537 (After Hours)    ° °  °
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
## 2016-06-03 ENCOUNTER — Encounter (HOSPITAL_COMMUNITY): Payer: Self-pay | Admitting: Emergency Medicine

## 2016-06-03 ENCOUNTER — Emergency Department (HOSPITAL_COMMUNITY)
Admission: EM | Admit: 2016-06-03 | Discharge: 2016-06-03 | Disposition: A | Discharge status: Home / Self Care | Payer: BLUE CROSS/BLUE SHIELD | Attending: Emergency Medicine | Admitting: Emergency Medicine

## 2016-06-03 DIAGNOSIS — F17210 Nicotine dependence, cigarettes, uncomplicated: Secondary | ICD-10-CM | POA: Insufficient documentation

## 2016-06-03 DIAGNOSIS — J069 Acute upper respiratory infection, unspecified: Secondary | ICD-10-CM | POA: Insufficient documentation

## 2016-06-03 DIAGNOSIS — H9202 Otalgia, left ear: Secondary | ICD-10-CM | POA: Insufficient documentation

## 2016-06-03 DIAGNOSIS — R05 Cough: Secondary | ICD-10-CM | POA: Diagnosis present

## 2016-06-03 DIAGNOSIS — R300 Dysuria: Secondary | ICD-10-CM | POA: Insufficient documentation

## 2016-06-03 MED ORDER — NEOMYCIN-POLYMYXIN-HC 1 % OT SOLN
4.0000 [drp] | Freq: Once | OTIC | Status: AC
  Administered 2016-06-03: 4 [drp] via OTIC
  Filled 2016-06-03: qty 10

## 2016-06-03 MED ORDER — IBUPROFEN 600 MG PO TABS: 600.0000 mg | ORAL_TABLET | Freq: Four times a day (QID) | ORAL | Status: DC | PRN

## 2016-06-03 MED ORDER — AMOXICILLIN 250 MG PO CAPS
500.0000 mg | ORAL_CAPSULE | Freq: Once | ORAL | Status: AC
  Administered 2016-06-03: 500 mg via ORAL
  Filled 2016-06-03: qty 2

## 2016-06-03 MED ORDER — HYDROGEN PEROXIDE 3 % EX SOLN
CUTANEOUS | Status: AC
  Filled 2016-06-03: qty 473

## 2016-06-03 MED ORDER — AMOXICILLIN 500 MG PO CAPS: 500.0000 mg | ORAL_CAPSULE | Freq: Three times a day (TID) | ORAL | Status: DC

## 2016-06-03 MED ORDER — OXYMETAZOLINE HCL 0.05 % NA SOLN
2.0000 | Freq: Once | NASAL | Status: AC
  Administered 2016-06-03: 2 via NASAL
  Filled 2016-06-03: qty 15

## 2016-06-03 MED ORDER — IBUPROFEN 800 MG PO TABS
800.0000 mg | ORAL_TABLET | Freq: Once | ORAL | Status: AC
  Administered 2016-06-03: 800 mg via ORAL
  Filled 2016-06-03: qty 1

## 2016-06-03 NOTE — ED Provider Notes (Signed)
CSN: [id number]     Arrival date & time 06/03/16  [id number] History   First MD Initiated Contact with Patient 06/03/16 0802     Chief Complaint  Patient presents with  . Cough     (Consider location/radiation/quality/duration/timing/severity/associated sxs/prior Treatment) Patient is a 25 y.o. male presenting with URI. The history is provided by the patient.  URI Presenting symptoms: congestion, cough and sore throat   Severity:  Moderate Onset quality:  Gradual Duration:  3 days Timing:  Intermittent Progression:  Worsening Chronicity:  New Relieved by:  Nothing Exacerbated by: swallowing. Associated symptoms: no arthralgias, no neck pain and no wheezing   Associated symptoms comment:  Left ear pain.,cough. Risk factors: sick contacts   Risk factors: no chronic kidney disease, no chronic respiratory disease, no diabetes mellitus, no immunosuppression and no recent travel     History reviewed. No pertinent past medical history. History reviewed. No pertinent past surgical history. History reviewed. No pertinent family history. Social History  Substance Use Topics  . Smoking status: Current Every Day Smoker -- 0.50 packs/day    Types: Cigarettes  . Smokeless tobacco: None  . Alcohol Use: Yes     Comment: occassional    Review of Systems  Constitutional: Negative for activity change.       All ROS Neg except as noted in HPI  HENT: Positive for congestion and sore throat. Negative for nosebleeds.   Eyes: Negative for photophobia and discharge.  Respiratory: Positive for cough. Negative for shortness of breath and wheezing.   Cardiovascular: Negative for chest pain and palpitations.  Gastrointestinal: Negative for vomiting, abdominal pain and blood in stool.  Genitourinary: Positive for dysuria. Negative for frequency and hematuria.  Musculoskeletal: Negative for back pain, arthralgias and neck pain.  Skin: Negative.  Negative for rash.  Neurological: Negative for dizziness,  seizures and speech difficulty.  Psychiatric/Behavioral: Negative for hallucinations and confusion.      Allergies  Review of patient's allergies indicates no known allergies.  Home Medications   Prior to Admission medications   Medication Sig Start Date End Date Taking? Authorizing Provider  ibuprofen (ADVIL,MOTRIN) 800 MG tablet Take one every 8 hours for abdominal cramps of aches Patient not taking: Reported on 10/22/2015 09/25/15   Bethann BerkshireJoseph Zammit, MD  naproxen (NAPROSYN) 500 MG tablet Take one pill every 12 hours as needed for pain 10/22/15   Bethann BerkshireJoseph Zammit, MD  ondansetron (ZOFRAN ODT) 4 MG disintegrating tablet 4mg  ODT q4 hours prn nausea/vomit Patient not taking: Reported on 10/22/2015 09/25/15   Bethann BerkshireJoseph Zammit, MD  ondansetron (ZOFRAN) 4 MG tablet Take 1 tablet (4 mg total) by mouth every 8 (eight) hours as needed for nausea or vomiting. Patient not taking: Reported on 10/22/2015 09/30/15   Samuel JesterKathleen McManus, DO   BP 127/79 mmHg  Pulse 70  Temp(Src) 97.4 F (36.3 C) (Oral)  Resp 16  Ht 5\' 5"  (1.651 m)  Wt 117.935 kg  BMI 43.27 kg/m2  SpO2 98% Physical Exam  Constitutional: He is oriented to person, place, and time. He appears well-developed and well-nourished.  Non-toxic appearance.  HENT:  Head: Normocephalic.  Right Ear: Tympanic membrane and external ear normal.  Left Ear: Tympanic membrane and external ear normal.  Left greater than right cerumen impaction. No redness or swelling in the mastoid areas. Nasal congestion present. Increase redness of the posterior pharynx. Uvula swollen with increase redness.  Eyes: EOM and lids are normal. Pupils are equal, round, and reactive to light.  Neck: Normal range of  motion. Neck supple. Carotid bruit is not present.  Cardiovascular: Normal rate, regular rhythm, normal heart sounds, intact distal pulses and normal pulses.   Pulmonary/Chest: Breath sounds normal. No respiratory distress.  Abdominal: Soft. Bowel sounds are normal.  There is no tenderness. There is no guarding.  Musculoskeletal: Normal range of motion.  Lymphadenopathy:       Head (right side): No submandibular adenopathy present.       Head (left side): No submandibular adenopathy present.    He has no cervical adenopathy.  Neurological: He is alert and oriented to person, place, and time. He has normal strength. No cranial nerve deficit or sensory deficit.  Skin: Skin is warm and dry. No rash noted.  Psychiatric: He has a normal mood and affect. His speech is normal.  Nursing note and vitals reviewed.   ED Course  Procedures (including critical care time) Labs Review Labs Reviewed - No data to display  Imaging Review No results found. I have personally reviewed and evaluated these images and lab results as part of my medical decision-making.   EKG Interpretation None      MDM      Vital signs reviewed.  After cerumen impaction removed. There in mild  increase redness of the TM. No bulging.  Exam favors URI and otalgia. Rx for amoxil and ibuprofen and afrin and cortisporin given to the patient.   Final diagnoses:  URI (upper respiratory infection)  Otalgia of left ear    *I have reviewed nursing notes, vital signs, and all appropriate lab and imaging results for this patient.Ivery Quale, PA-C 06/04/16 1202  [name], MD 06/04/16 [phone]

## 2016-06-03 NOTE — ED Notes (Signed)
Warm water and peroxide put in left ear to loosen wax impaction. Ear flushed with warm water. Patient tolerated well. Wax impaction still in canal. Warm water and peroxide re-administered to left ear, will let set for 10-15 minutes flush ear again.

## 2016-06-03 NOTE — Discharge Instructions (Signed)
You have a wax buildup in both ears, but the left worse than the right. Please use one of the ear wax removal kits from the drugstore. If this is not effective, please see the ear nose and throat specialist listed above. Please use salt water gargles 3 or 4 times daily. Wash hands frequently. Use Amoxil and ibuprofen daily with food. Please use 3 drops of the Cortisporin ear solution in the left ear 3 times daily. Use 2 squirts of Afrin in each nostril every 8 hours for 5 days only. Upper Respiratory Infection, Adult Most upper respiratory infections (URIs) are a viral infection of the air passages leading to the lungs. A URI affects the nose, throat, and upper air passages. The most common type of URI is nasopharyngitis and is typically referred to as "the common cold." URIs run their course and usually go away on their own. Most of the time, a URI does not require medical attention, but sometimes a bacterial infection in the upper airways can follow a viral infection. This is called a secondary infection. Sinus and middle ear infections are common types of secondary upper respiratory infections. Bacterial pneumonia can also complicate a URI. A URI can worsen asthma and chronic obstructive pulmonary disease (COPD). Sometimes, these complications can require emergency medical care and may be life threatening.  CAUSES Almost all URIs are caused by viruses. A virus is a type of germ and can spread from one person to another.  RISKS FACTORS You may be at risk for a URI if:   You smoke.   You have chronic heart or lung disease.  You have a weakened defense (immune) system.   You are very young or very old.   You have nasal allergies or asthma.  You work in crowded or poorly ventilated areas.  You work in health care facilities or schools. SIGNS AND SYMPTOMS  Symptoms typically develop 2-3 days after you come in contact with a cold virus. Most viral URIs last 7-10 days. However, viral URIs from  the influenza virus (flu virus) can last 14-18 days and are typically more severe. Symptoms may include:   Runny or stuffy (congested) nose.   Sneezing.   Cough.   Sore throat.   Headache.   Fatigue.   Fever.   Loss of appetite.   Pain in your forehead, behind your eyes, and over your cheekbones (sinus pain).  Muscle aches.  DIAGNOSIS  Your health care provider may diagnose a URI by:  Physical exam.  Tests to check that your symptoms are not due to another condition such as:  Strep throat.  Sinusitis.  Pneumonia.  Asthma. TREATMENT  A URI goes away on its own with time. It cannot be cured with medicines, but medicines may be prescribed or recommended to relieve symptoms. Medicines may help:  Reduce your fever.  Reduce your cough.  Relieve nasal congestion. HOME CARE INSTRUCTIONS   Take medicines only as directed by your health care provider.   Gargle warm saltwater or take cough drops to comfort your throat as directed by your health care provider.  Use a warm mist humidifier or inhale steam from a shower to increase air moisture. This may make it easier to breathe.  Drink enough fluid to keep your urine clear or pale yellow.   Eat soups and other clear broths and maintain good nutrition.   Rest as needed.   Return to work when your temperature has returned to normal or as your health care provider  advises. You may need to stay home longer to avoid infecting others. You can also use a face mask and careful hand washing to prevent spread of the virus.  Increase the usage of your inhaler if you have asthma.   Do not use any tobacco products, including cigarettes, chewing tobacco, or electronic cigarettes. If you need help quitting, ask your health care provider. PREVENTION  The best way to protect yourself from getting a cold is to practice good hygiene.   Avoid oral or hand contact with people with cold symptoms.   Wash your hands often  if contact occurs.  There is no clear evidence that vitamin C, vitamin E, echinacea, or exercise reduces the chance of developing a cold. However, it is always recommended to get plenty of rest, exercise, and practice good nutrition.  SEEK MEDICAL CARE IF:   You are getting worse rather than better.   Your symptoms are not controlled by medicine.   You have chills.  You have worsening shortness of breath.  You have brown or red mucus.  You have yellow or brown nasal discharge.  You have pain in your face, especially when you bend forward.  You have a fever.  You have swollen neck glands.  You have pain while swallowing.  You have white areas in the back of your throat. SEEK IMMEDIATE MEDICAL CARE IF:   You have severe or persistent:  Headache.  Ear pain.  Sinus pain.  Chest pain.  You have chronic lung disease and any of the following:  Wheezing.  Prolonged cough.  Coughing up blood.  A change in your usual mucus.  You have a stiff neck.  You have changes in your:  Vision.  Hearing.  Thinking.  Mood. MAKE SURE YOU:   Understand these instructions.  Will watch your condition.  Will get help right away if you are not doing well or get worse.   This information is not intended to replace advice given to you by your health care provider. Make sure you discuss any questions you have with your health care provider.   Document Released: 05/29/2001 Document Revised: 04/19/2015 Document Reviewed: 03/10/2014 Elsevier Interactive Patient Education [employer]

## 2016-06-03 NOTE — ED Notes (Signed)
Pt states he has a sore throat, left ear pain, cough, and congestion.  Began a few days ago after going to [employer].

## 2016-06-03 NOTE — ED Notes (Signed)
Large wax impaction removed. Patient reports relief in ear fullness. EDPA made aware.
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
## 2016-08-06 ENCOUNTER — Encounter (HOSPITAL_COMMUNITY): Payer: Self-pay | Admitting: Emergency Medicine

## 2016-08-06 ENCOUNTER — Emergency Department (HOSPITAL_COMMUNITY)
Admission: EM | Admit: 2016-08-06 | Discharge: 2016-08-07 | Disposition: A | Discharge status: Home / Self Care | Payer: BLUE CROSS/BLUE SHIELD | Attending: Emergency Medicine | Admitting: Emergency Medicine

## 2016-08-06 DIAGNOSIS — Y939 Activity, unspecified: Secondary | ICD-10-CM | POA: Insufficient documentation

## 2016-08-06 DIAGNOSIS — S025XXA Fracture of tooth (traumatic), initial encounter for closed fracture: Secondary | ICD-10-CM | POA: Diagnosis not present

## 2016-08-06 DIAGNOSIS — F17210 Nicotine dependence, cigarettes, uncomplicated: Secondary | ICD-10-CM | POA: Diagnosis not present

## 2016-08-06 DIAGNOSIS — X58XXXA Exposure to other specified factors, initial encounter: Secondary | ICD-10-CM | POA: Insufficient documentation

## 2016-08-06 DIAGNOSIS — Y999 Unspecified external cause status: Secondary | ICD-10-CM | POA: Diagnosis not present

## 2016-08-06 DIAGNOSIS — Y929 Unspecified place or not applicable: Secondary | ICD-10-CM | POA: Insufficient documentation

## 2016-08-06 DIAGNOSIS — K0889 Other specified disorders of teeth and supporting structures: Secondary | ICD-10-CM | POA: Diagnosis present

## 2016-08-06 NOTE — ED Triage Notes (Signed)
Pt c/o dental pain x 2 days.  

## 2016-08-07 MED ORDER — HYDROCODONE-ACETAMINOPHEN 5-325 MG PO TABS
2.0000 | ORAL_TABLET | Freq: Once | ORAL | Status: AC
  Administered 2016-08-07: 2 via ORAL
  Filled 2016-08-07: qty 2

## 2016-08-07 MED ORDER — AMOXICILLIN 250 MG PO CAPS
500.0000 mg | ORAL_CAPSULE | Freq: Once | ORAL | Status: AC
  Administered 2016-08-07: 500 mg via ORAL
  Filled 2016-08-07: qty 2

## 2016-08-07 MED ORDER — AMOXICILLIN 500 MG PO CAPS: 500.0000 mg | ORAL_CAPSULE | Freq: Three times a day (TID) | ORAL | 0 refills | Status: DC

## 2016-08-07 MED ORDER — DICLOFENAC SODIUM 50 MG PO TBEC: 50.0000 mg | DELAYED_RELEASE_TABLET | Freq: Two times a day (BID) | ORAL | 0 refills | Status: DC

## 2016-08-07 NOTE — ED Provider Notes (Signed)
[facility] Provider Note   CSN: [id number] Arrival date & time: 08/06/16  2151     History   Chief Complaint Chief Complaint  Patient presents with  . Dental Pain    HPI Cody Hughes is a 25 y.o. male.  The history is provided by the patient. No language interpreter was used.  Dental Pain   This is a new problem. The current episode started more than 1 week ago. The problem occurs constantly. The problem has been gradually worsening. The pain is moderate. He has tried nothing for the symptoms. The treatment provided no relief.  Pt complains of pain in right side of his mouth.  Pt has swelling to his face.  Pt does not have a dentist.  History reviewed. No pertinent past medical history.  There are no active problems to display for this patient.   History reviewed. No pertinent surgical history.     Home Medications    Prior to Admission medications   Medication Sig Start Date End Date Taking? Authorizing Provider  amoxicillin (AMOXIL) 500 MG capsule Take 1 capsule (500 mg total) by mouth 3 (three) times daily. 06/03/16   Ivery QualeHobson Bryant, PA-C  ibuprofen (ADVIL,MOTRIN) 600 MG tablet Take 1 tablet (600 mg total) by mouth every 6 (six) hours as needed. 06/03/16   Ivery QualeHobson Bryant, PA-C  naproxen (NAPROSYN) 500 MG tablet Take one pill every 12 hours as needed for pain 10/22/15   Bethann BerkshireJoseph Zammit, MD  ondansetron (ZOFRAN ODT) 4 MG disintegrating tablet 4mg  ODT q4 hours prn nausea/vomit Patient not taking: Reported on 10/22/2015 09/25/15   Bethann BerkshireJoseph Zammit, MD  ondansetron (ZOFRAN) 4 MG tablet Take 1 tablet (4 mg total) by mouth every 8 (eight) hours as needed for nausea or vomiting. Patient not taking: Reported on 10/22/2015 09/30/15   Samuel JesterKathleen McManus, DO    Family History History reviewed. No pertinent family history.  Social History Social History  Substance Use Topics  . Smoking status: Current Every Day Smoker    Packs/day: 0.50    Types: Cigarettes  . Smokeless  tobacco: Never Used  . Alcohol use Yes     Comment: occassional     Allergies   Review of patient's allergies indicates no known allergies.   Review of Systems Review of Systems  All other systems reviewed and are negative.    Physical Exam Updated Vital Signs BP 122/75 (BP Location: Left Arm)   Pulse 71   Temp 98.3 F (36.8 C) (Oral)   Resp 20   Ht 5\' 5"  (1.651 m)   Wt 117.9 kg   SpO2 99%   BMI 43.27 kg/m   Physical Exam  Constitutional: He appears well-developed and well-nourished.  HENT:  Head: Normocephalic and atraumatic.  Broken teeth,  Swelling lower right gum, no obvious abscess, swelling to face.   Eyes: Conjunctivae are normal.  Neck: Neck supple.  Cardiovascular: Normal rate and regular rhythm.   No murmur heard. Pulmonary/Chest: Effort normal and breath sounds normal. No respiratory distress.  Abdominal: Soft. There is no tenderness.  Musculoskeletal: He exhibits no edema.  Neurological: He is alert.  Skin: Skin is warm and dry.  Psychiatric: He has a normal mood and affect.  Nursing note and vitals reviewed.    ED Treatments / Results  Labs (all labs ordered are listed, but only abnormal results are displayed) Labs Reviewed - No data to display  EKG  EKG Interpretation None       Radiology No results found.  Procedures  Procedures (including critical care time)  Medications Ordered in ED Medications - No data to display   Initial Impression / Assessment and Plan / ED Course  I have reviewed the triage vital signs and the nursing notes.  Pertinent labs & imaging results that were available during my care of the patient were reviewed by me and considered in my medical decision making (see chart for details).  Clinical Course    Dental infection  Possible early abscess.  Pt advised to follow up with dentist. Antibiotic rx.   Final Clinical Impressions(s) / ED Diagnoses   Final diagnoses:  Toothache   Meds ordered this  encounter  Medications  . amoxicillin (AMOXIL) 500 MG capsule    Sig: Take 1 capsule (500 mg total) by mouth 3 (three) times daily.    Dispense:  30 capsule    Refill:  0    Order Specific Question:   Supervising Provider    Answer:   MILLER, BRIAN [[id number]]  . diclofenac (VOLTAREN) 50 MG EC tablet    Sig: Take 1 tablet (50 mg total) by mouth 2 (two) times daily.    Dispense:  20 tablet    Refill:  0    Order Specific Question:   Supervising Provider    Answer:   Eber HongMILLER, BRIAN [[id number]]    New Prescriptions New Prescriptions   No medications on file  An After Visit Summary was printed and given to the patient.   Lonia SkinnerLeslie K NathalieSofia, PA-C 08/07/16 [phone]    Devoria AlbeIva Knapp, MD 08/07/16 [phone]

## 2017-05-10 ENCOUNTER — Emergency Department (HOSPITAL_COMMUNITY)
Admission: EM | Admit: 2017-05-10 | Discharge: 2017-05-10 | Disposition: A | Discharge status: Home / Self Care | Payer: BLUE CROSS/BLUE SHIELD | Attending: Emergency Medicine | Admitting: Emergency Medicine

## 2017-05-10 ENCOUNTER — Encounter (HOSPITAL_COMMUNITY): Payer: Self-pay | Admitting: *Deleted

## 2017-05-10 DIAGNOSIS — K0889 Other specified disorders of teeth and supporting structures: Secondary | ICD-10-CM

## 2017-05-10 DIAGNOSIS — F17210 Nicotine dependence, cigarettes, uncomplicated: Secondary | ICD-10-CM | POA: Diagnosis not present

## 2017-05-10 DIAGNOSIS — K029 Dental caries, unspecified: Secondary | ICD-10-CM | POA: Insufficient documentation

## 2017-05-10 MED ORDER — IBUPROFEN 400 MG PO TABS
600.0000 mg | ORAL_TABLET | Freq: Once | ORAL | Status: AC
  Administered 2017-05-10: 600 mg via ORAL
  Filled 2017-05-10: qty 2

## 2017-05-10 MED ORDER — PENICILLIN V POTASSIUM 500 MG PO TABS: 500.0000 mg | ORAL_TABLET | Freq: Four times a day (QID) | ORAL | 0 refills | Status: AC

## 2017-05-10 MED ORDER — PENICILLIN V POTASSIUM 250 MG PO TABS
500.0000 mg | ORAL_TABLET | Freq: Once | ORAL | Status: AC
  Administered 2017-05-10: 500 mg via ORAL
  Filled 2017-05-10: qty 2

## 2017-05-10 MED ORDER — ACETAMINOPHEN 500 MG PO TABS
1000.0000 mg | ORAL_TABLET | Freq: Once | ORAL | Status: AC
Start: 2017-05-10 — End: 2017-05-10
  Administered 2017-05-10: 1000 mg via ORAL
  Filled 2017-05-10: qty 2

## 2017-05-10 NOTE — ED Provider Notes (Signed)
[facility] Provider Note   CSN: [id number] Arrival date & time: 05/10/17  0419  Time seen 05:15 AM   History   Chief Complaint Chief Complaint  Patient presents with  . Dental Pain    HPI Cody Hughes is a 26 y.o. male.  HPI  patient states one of his right lower molars broke off at least a month ago, he states however he did not start having problems until the past week when it started hurting. He denies any facial swelling or swelling of the gums, he denies fever, trouble swallowing or breathing. Patient does not have a dentist. He was seen in the ED for this same tooth in August 2017  PCP none  History reviewed. No pertinent past medical history.  There are no active problems to display for this patient.   History reviewed. No pertinent surgical history.     Home Medications    Prior to Admission medications   Medication Sig Start Date End Date Taking? Authorizing Provider  amoxicillin (AMOXIL) 500 MG capsule Take 1 capsule (500 mg total) by mouth 3 (three) times daily. 08/07/16   Elson Areas, PA-C  diclofenac (VOLTAREN) 50 MG EC tablet Take 1 tablet (50 mg total) by mouth 2 (two) times daily. 08/07/16   Elson Areas, PA-C  penicillin v potassium (VEETID) 500 MG tablet Take 1 tablet (500 mg total) by mouth 4 (four) times daily. 05/10/17 05/17/17  Devoria Albe, MD    Family History No family history on file.  Social History Social History  Substance Use Topics  . Smoking status: Current Every Day Smoker    Packs/day: 0.50    Types: Cigarettes  . Smokeless tobacco: Never Used  . Alcohol use Yes     Comment: occassional     Allergies   Patient has no known allergies.   Review of Systems Review of Systems  All other systems reviewed and are negative.    Physical Exam Updated Vital Signs BP 114/60 (BP Location: Left Arm)   Pulse 72   Temp 98.1 F (36.7 C) (Oral)   Resp 18   Ht 5\' 5"  (1.651 m)   Wt 124.7 kg (275 lb)   SpO2 98%   BMI  45.76 kg/m   Vital signs normal    Physical Exam  Constitutional: He is oriented to person, place, and time. He appears well-developed and well-nourished.  HENT:  Head: Normocephalic and atraumatic.  Right Ear: External ear normal.  Left Ear: External ear normal.  Mouth/Throat:    There is no induration when I palpate the skin of his cheek overlying that area where his tooth is hurting. There is no increased swelling underneath the mandible, no swollen lymph nodes.   Eyes: Conjunctivae and EOM are normal.  Neck: Normal range of motion.  Cardiovascular: Normal rate.   Pulmonary/Chest: Effort normal. No respiratory distress.  Musculoskeletal: Normal range of motion.  Neurological: He is alert and oriented to person, place, and time. No cranial nerve deficit.  Skin: Skin is warm and dry.  Psychiatric: He has a normal mood and affect. His behavior is normal. Thought content normal.     ED Treatments / Results   Procedures Procedures (including critical care time)  Medications Ordered in ED Medications  acetaminophen (TYLENOL) tablet 1,000 mg (not administered)  ibuprofen (ADVIL,MOTRIN) tablet 600 mg (not administered)  penicillin v potassium (VEETID) tablet 500 mg (not administered)     Initial Impression / Assessment and Plan / ED Course  I have reviewed the triage vital signs and the nursing notes.  Pertinent labs & imaging results that were available during my care of the patient were reviewed by me and considered in my medical decision making (see chart for details).  Patient was given Tylenol and Motrin for pain, he was started on Pen-Vee K. We discussed the need to see a dentist for definitive care of his dental caries.  Final Clinical Impressions(s) / ED Diagnoses   Final diagnoses:  Pain, dental  Dental caries    New Prescriptions New Prescriptions   PENICILLIN V POTASSIUM (VEETID) 500 MG TABLET    Take 1 tablet (500 mg total) by mouth 4 (four) times daily.   acetaminophen/motrin OTC  Plan discharge  Devoria AlbeIva Somtochukwu Woollard, MD, [facility] PyoFACEP    Sheril Hammond, MD 05/10/17 [phone]

## 2017-05-10 NOTE — Discharge Instructions (Signed)
Use ice over the painful area on your face. Take the antibiotics until gone. Take ibuprofen 600 mg + acetaminophen 1000 mg 4 times a day for pain. You will need to see a dentist to get definitive care of your dental problems.   Return to the ED if you get a fever or have a lot of facial swelling.

## 2017-05-10 NOTE — ED Notes (Signed)
Pt alert & oriented x4, stable gait. Patient given discharge instructions, paperwork & prescription(s). Patient  instructed to stop at the registration desk to finish any additional paperwork. Patient verbalized understanding. Pt left department w/ no further questions. 

## 2017-05-10 NOTE — ED Triage Notes (Signed)
Pt reports dental pain for a while. Says tooth has broke off now.

## 2017-05-20 ENCOUNTER — Encounter (HOSPITAL_COMMUNITY): Payer: Self-pay | Admitting: *Deleted

## 2017-05-20 DIAGNOSIS — F17210 Nicotine dependence, cigarettes, uncomplicated: Secondary | ICD-10-CM | POA: Diagnosis not present

## 2017-05-20 DIAGNOSIS — Z79899 Other long term (current) drug therapy: Secondary | ICD-10-CM | POA: Diagnosis not present

## 2017-05-20 DIAGNOSIS — R197 Diarrhea, unspecified: Secondary | ICD-10-CM | POA: Diagnosis not present

## 2017-05-20 DIAGNOSIS — R52 Pain, unspecified: Secondary | ICD-10-CM | POA: Diagnosis present

## 2017-05-20 NOTE — ED Triage Notes (Signed)
Pt c/o generalized body aches; pt states he has been taking an antibiotic for an infected tooth and tylenol for the pain and states he has not been feeling well

## 2017-05-21 ENCOUNTER — Emergency Department (HOSPITAL_COMMUNITY)
Admission: EM | Admit: 2017-05-21 | Discharge: 2017-05-21 | Disposition: A | Discharge status: Home / Self Care | Payer: BLUE CROSS/BLUE SHIELD | Attending: Emergency Medicine | Admitting: Emergency Medicine

## 2017-05-21 DIAGNOSIS — R197 Diarrhea, unspecified: Secondary | ICD-10-CM

## 2017-05-21 NOTE — Discharge Instructions (Signed)
Avoid milk products they will make the diarrhea worse (milk, ice cream). DO however eat yogurt which should help with the diarrhea. Take imodium OTC for your diarrhea. Drink plenty of fluids. Recheck if you get a fever, abdominal pain or the diarrhea gets worse. Finish the antibiotic, you need to see a dentist about your dental problems.

## 2017-05-21 NOTE — ED Provider Notes (Signed)
[facility] Provider Note   CSN: [id number] Arrival date & time: 05/20/17  2311  Time seen 02:08 AM   History   Chief Complaint Chief Complaint  Patient presents with  . Generalized Body Aches    HPI Cody Hughes is a 26 y.o. male.  HPI  patient reports he was seen on May 25 for dental pain and was started on Pen-Vee K. Interestingly he states he didn't get it [facility] at [facility] as it was $42 and got it [place] somewhere else [place]. It [facility]'ve [facility] a [facility]. He states since he started the antibiotic 4 days ago he's had diarrhea about twice a day. He denies abdominal pain, nausea, vomiting, feeling lightheaded or weakness. He states he feels "not up to par". He denies fever. He denies worsening of his dental pain.  PCP none  History reviewed. No pertinent past medical history.  There are no active problems to display for this patient.   History reviewed. No pertinent surgical history.     Home Medications    Prior to Admission medications   Medication Sig Start Date End Date Taking? Authorizing Provider  amoxicillin (AMOXIL) 500 MG capsule Take 1 capsule (500 mg total) by mouth 3 (three) times daily. 08/07/16   Elson AreasSofia, Leslie K, PA-C  diclofenac (VOLTAREN) 50 MG EC tablet Take 1 tablet (50 mg total) by mouth 2 (two) times daily. 08/07/16   Elson AreasSofia, Leslie K, PA-C    Family History History reviewed. No pertinent family history.  Social History Social History  Substance Use Topics  . Smoking status: Current Every Day Smoker    Packs/day: 0.50    Types: Cigarettes  . Smokeless tobacco: Never Used  . Alcohol use Yes     Comment: occassional     Allergies   Patient has no known allergies.   Review of Systems Review of Systems  All other systems reviewed and are negative.    Physical Exam Updated Vital Signs BP (!) 121/48 (BP Location: Right Arm)   Pulse 95   Temp 98 F (36.7 C) (Oral)   Resp 18   Ht 5\' 5"  (1.651 m)    Wt 124.7 kg (275 lb)   SpO2 99%   BMI 45.76 kg/m   Vital signs normal    Physical Exam  Constitutional: He is oriented to person, place, and time. He appears well-developed and well-nourished.  Non-toxic appearance. He does not appear ill. No distress.  HENT:  Head: Normocephalic and atraumatic.  Right Ear: External ear normal.  Left Ear: External ear normal.  Nose: Nose normal. No mucosal edema or rhinorrhea.  Mouth/Throat: Oropharynx is clear and moist and mucous membranes are normal. No dental abscesses or uvula swelling.  Tatian's his missing 3 molars which are rotted to the gumline on the right lower jaw with the one tooth present as before. The gums do not appear to be more swollen. He is speaking without difficulty.  Eyes: Conjunctivae and EOM are normal. Pupils are equal, round, and reactive to light.  Neck: Normal range of motion and full passive range of motion without pain. Neck supple.  Cardiovascular: Normal rate, regular rhythm and normal heart sounds.  Exam reveals no gallop and no friction rub.   No murmur heard. Pulmonary/Chest: Effort normal and breath sounds normal. No respiratory distress. He has no wheezes. He has no rhonchi. He has no rales. He exhibits no tenderness and no crepitus.  Abdominal: Soft. Normal appearance and bowel sounds are normal.  He exhibits no distension. There is no tenderness. There is no rebound and no guarding.  Musculoskeletal: Normal range of motion. He exhibits no edema or tenderness.  Moves all extremities well.   Neurological: He is alert and oriented to person, place, and time. He has normal strength. No cranial nerve deficit.  Skin: Skin is warm, dry and intact. No rash noted. No erythema. No pallor.  Psychiatric: He has a normal mood and affect. His speech is normal and behavior is normal. His mood appears not anxious.  Nursing note and vitals reviewed.    ED Treatments / Results  Labs (all labs ordered are listed, but only  abnormal results are displayed) Labs Reviewed - No data to display  EKG  EKG Interpretation None       Radiology No results found.  Procedures Procedures (including critical care time)  Medications Ordered in ED Medications - No data to display   Initial Impression / Assessment and Plan / ED Course  I have reviewed the triage vital signs and the nursing notes.  Pertinent labs & imaging results that were available during my care of the patient were reviewed by me and considered in my medical decision making (see chart for details).  It appears patient's main complaint is he started having diarrhea after he started taking the antibiotic. He was advised to eat yogurt, take Imodium as needed for diarrhea. He should drink plenty of fluids. He needs to see a dentist to get definitive care of his teeth, if the diarrhea gets worse or if he gets fever or abdominal pain he should be rechecked.  Final Clinical Impressions(s) / ED Diagnoses   Final diagnoses:  Diarrhea, unspecified type    New Prescriptions OTC imodium  Plan discharge  Devoria Albe, MD, Concha Pyo, MD 05/21/17 [phone]

## 2017-08-15 ENCOUNTER — Encounter (HOSPITAL_COMMUNITY): Payer: Self-pay | Admitting: Emergency Medicine

## 2017-08-15 ENCOUNTER — Emergency Department (HOSPITAL_COMMUNITY)
Admission: EM | Admit: 2017-08-15 | Discharge: 2017-08-15 | Disposition: A | Discharge status: Home / Self Care | Payer: BLUE CROSS/BLUE SHIELD | Attending: Emergency Medicine | Admitting: Emergency Medicine

## 2017-08-15 DIAGNOSIS — K029 Dental caries, unspecified: Secondary | ICD-10-CM | POA: Insufficient documentation

## 2017-08-15 DIAGNOSIS — K0889 Other specified disorders of teeth and supporting structures: Secondary | ICD-10-CM | POA: Diagnosis present

## 2017-08-15 DIAGNOSIS — F17210 Nicotine dependence, cigarettes, uncomplicated: Secondary | ICD-10-CM | POA: Diagnosis not present

## 2017-08-15 DIAGNOSIS — Z79899 Other long term (current) drug therapy: Secondary | ICD-10-CM | POA: Insufficient documentation

## 2017-08-15 MED ORDER — AMOXICILLIN 250 MG PO CAPS
500.0000 mg | ORAL_CAPSULE | Freq: Once | ORAL | Status: AC
  Administered 2017-08-15: 500 mg via ORAL
  Filled 2017-08-15: qty 2

## 2017-08-15 MED ORDER — ONDANSETRON HCL 4 MG PO TABS
4.0000 mg | ORAL_TABLET | Freq: Once | ORAL | Status: AC
  Administered 2017-08-15: 4 mg via ORAL
  Filled 2017-08-15: qty 1

## 2017-08-15 MED ORDER — IBUPROFEN 800 MG PO TABS
800.0000 mg | ORAL_TABLET | Freq: Once | ORAL | Status: AC
  Administered 2017-08-15: 800 mg via ORAL
  Filled 2017-08-15: qty 1

## 2017-08-15 MED ORDER — ACETAMINOPHEN 325 MG PO TABS
650.0000 mg | ORAL_TABLET | Freq: Once | ORAL | Status: AC
  Administered 2017-08-15: 650 mg via ORAL
  Filled 2017-08-15: qty 2

## 2017-08-15 MED ORDER — IBUPROFEN 600 MG PO TABS: 600.0000 mg | ORAL_TABLET | Freq: Four times a day (QID) | ORAL | 0 refills | Status: DC

## 2017-08-15 MED ORDER — AMOXICILLIN 500 MG PO CAPS: 500.0000 mg | ORAL_CAPSULE | Freq: Three times a day (TID) | ORAL | 0 refills | Status: DC

## 2017-08-15 NOTE — ED Triage Notes (Signed)
Pt reports broken tooth on right side.

## 2017-08-15 NOTE — ED Notes (Signed)
Patient given discharge instruction, verbalized understand. Patient ambulatory out of the department.  

## 2017-08-15 NOTE — ED Provider Notes (Signed)
[facility] Provider Note   CSN: [id number] Arrival date & time: 08/15/17  1656     History   Chief Complaint Chief Complaint  Patient presents with  . Dental Pain    HPI Cody Hughes is a 26 y.o. male.  Patient is a 26 year old male who presents to the emergency department with complaint of right-sided jaw pain. The patient states that he's been having problems with bad teeth for some time. He is not sure exactly when but he had a tooth to break off while eating. He states however he had minimal pain with it until yesterday when he started having a throbbing type sensation involving the right side of his jaw. He has tried anti-inflammatory medication and cold rinses, but these have not been successful. He presents to the emergency department for assistance with his dental pain and jaw pain.      History reviewed. No pertinent past medical history.  There are no active problems to display for this patient.   History reviewed. No pertinent surgical history.     Home Medications    Prior to Admission medications   Medication Sig Start Date End Date Taking? Authorizing Provider  amoxicillin (AMOXIL) 500 MG capsule Take 1 capsule (500 mg total) by mouth 3 (three) times daily. 08/15/17   Ivery QualeBryant, Ketina Mars, PA-C  diclofenac (VOLTAREN) 50 MG EC tablet Take 1 tablet (50 mg total) by mouth 2 (two) times daily. 08/07/16   Elson AreasSofia, Leslie K, PA-C  ibuprofen (ADVIL,MOTRIN) 600 MG tablet Take 1 tablet (600 mg total) by mouth 4 (four) times daily. 08/15/17   Ivery QualeBryant, Journii Nierman, PA-C    Family History History reviewed. No pertinent family history.  Social History Social History  Substance Use Topics  . Smoking status: Current Every Day Smoker    Packs/day: 0.50    Types: Cigarettes  . Smokeless tobacco: Never Used  . Alcohol use Yes     Comment: occassional     Allergies   Patient has no known allergies.   Review of Systems Review of Systems  Constitutional: Negative  for activity change.       All ROS Neg except as noted in HPI  HENT: Positive for dental problem. Negative for nosebleeds.   Eyes: Negative for photophobia and discharge.  Respiratory: Negative for cough, shortness of breath and wheezing.   Cardiovascular: Negative for chest pain and palpitations.  Gastrointestinal: Negative for abdominal pain and blood in stool.  Genitourinary: Negative for dysuria, frequency and hematuria.  Musculoskeletal: Negative for arthralgias, back pain and neck pain.  Skin: Negative.   Neurological: Negative for dizziness, seizures and speech difficulty.  Psychiatric/Behavioral: Negative for confusion and hallucinations.     Physical Exam Updated Vital Signs BP (!) 141/83 (BP Location: Right Arm)   Pulse 81   Temp 97.7 F (36.5 C) (Oral)   Resp 18   SpO2 96%   Physical Exam  Constitutional: He is oriented to person, place, and time. He appears well-developed and well-nourished.  Non-toxic appearance.  HENT:  Head: Normocephalic.  Right Ear: Tympanic membrane and external ear normal.  Left Ear: Tympanic membrane and external ear normal.  Right lower premolar and first molar and third molar decayed to the gum line. There is swelling of the gum. No visible abscess appreciated. There is tenderness of the right lower gum. There are other dental caries throughout the mouth. There is no swelling under the tongue. Airway is patent.  Eyes: Pupils are equal, round, and reactive to light. EOM  and lids are normal.  Neck: Normal range of motion. Neck supple. Carotid bruit is not present.  Cardiovascular: Normal rate, regular rhythm, normal heart sounds, intact distal pulses and normal pulses.   Pulmonary/Chest: Breath sounds normal. No respiratory distress.  Abdominal: Soft. Bowel sounds are normal. There is no tenderness. There is no guarding.  Musculoskeletal: Normal range of motion.  Lymphadenopathy:       Head (right side): No submandibular adenopathy present.         Head (left side): No submandibular adenopathy present.    He has no cervical adenopathy.  Neurological: He is alert and oriented to person, place, and time. He has normal strength. No cranial nerve deficit or sensory deficit.  Skin: Skin is warm and dry.  Psychiatric: He has a normal mood and affect. His speech is normal.  Nursing note and vitals reviewed.    ED Treatments / Results  Labs (all labs ordered are listed, but only abnormal results are displayed) Labs Reviewed - No data to display  EKG  EKG Interpretation None       Radiology No results found.  Procedures Procedures (including critical care time)  Medications Ordered in ED Medications  amoxicillin (AMOXIL) capsule 500 mg (not administered)  ibuprofen (ADVIL,MOTRIN) tablet 800 mg (not administered)  acetaminophen (TYLENOL) tablet 650 mg (not administered)  ondansetron (ZOFRAN) tablet 4 mg (not administered)     Initial Impression / Assessment and Plan / ED Course  I have reviewed the triage vital signs and the nursing notes.  Pertinent labs & imaging results that were available during my care of the patient were reviewed by me and considered in my medical decision making (see chart for details).       Final Clinical Impressions(s) / ED Diagnoses MDM Vital signs reviewed. Patient has multiple dental caries present, right greater than left. No visible abscess. No evidence for Ludwig's Angina. Prescription for Amoxil and ibuprofen given to the patient. The patient is asked to use warm salt water swishes. The patient is given dental resources, and I advised him of the importance of seeing a dentist as soon as possible given the severity of his dental decay. Patient knowledge is understanding of the instructions and is in agreement.    Final diagnoses:  Dental caries    New Prescriptions New Prescriptions   AMOXICILLIN (AMOXIL) 500 MG CAPSULE    Take 1 capsule (500 mg total) by mouth 3 (three) times  daily.   IBUPROFEN (ADVIL,MOTRIN) 600 MG TABLET    Take 1 tablet (600 mg total) by mouth 4 (four) times daily.     Ivery Quale, PA-C 08/15/17 1804    Samuel Jester, DO 08/16/17 2100

## 2017-08-15 NOTE — Discharge Instructions (Signed)
You have several deep cavities, especially on the right side. It is important that she see a dentist as soon as possible to prevent spread of infection to vital areas. Please use Amoxil 3 times daily until all taken. Use ibuprofen with breakfast, lunch, dinner, and at bedtime for swelling and for pain. You may use Tylenol Extra Strength in between the ibuprofen doses if needed.

## 2021-04-17 ENCOUNTER — Other Ambulatory Visit: Payer: Self-pay

## 2021-04-18 ENCOUNTER — Encounter: Payer: Self-pay | Admitting: Internal Medicine

## 2021-04-18 ENCOUNTER — Ambulatory Visit (INDEPENDENT_AMBULATORY_CARE_PROVIDER_SITE_OTHER): Payer: Managed Care, Other (non HMO) | Admitting: Internal Medicine

## 2021-04-18 VITALS — BP 110/70 | HR 69 | Temp 98.0°F | Ht 65.0 in | Wt 264.9 lb

## 2021-04-18 DIAGNOSIS — Z800 Family history of malignant neoplasm of digestive organs: Secondary | ICD-10-CM

## 2021-04-18 DIAGNOSIS — Z0000 Encounter for general adult medical examination without abnormal findings: Secondary | ICD-10-CM

## 2021-04-18 DIAGNOSIS — Z8249 Family history of ischemic heart disease and other diseases of the circulatory system: Secondary | ICD-10-CM | POA: Diagnosis not present

## 2021-04-18 DIAGNOSIS — F17210 Nicotine dependence, cigarettes, uncomplicated: Secondary | ICD-10-CM

## 2021-04-18 DIAGNOSIS — F17200 Nicotine dependence, unspecified, uncomplicated: Secondary | ICD-10-CM | POA: Insufficient documentation

## 2021-04-18 LAB — COMPREHENSIVE METABOLIC PANEL
ALT: 13 U/L (ref 0–53)
AST: 13 U/L (ref 0–37)
Albumin: 4.2 g/dL (ref 3.5–5.2)
Alkaline Phosphatase: 66 U/L (ref 39–117)
BUN: 14 mg/dL (ref 6–23)
CO2: 27 mEq/L (ref 19–32)
Calcium: 9.3 mg/dL (ref 8.4–10.5)
Chloride: 105 mEq/L (ref 96–112)
Creatinine, Ser: 0.91 mg/dL (ref 0.40–1.50)
GFR: 113.1 mL/min (ref >60.00)
Glucose, Bld: 96 mg/dL (ref 70–99)
Potassium: 4.1 mEq/L (ref 3.5–5.1)
Sodium: 140 mEq/L (ref 135–145)
Total Bilirubin: 0.3 mg/dL (ref 0.2–1.2)
Total Protein: 6.7 g/dL (ref 6.0–8.3)

## 2021-04-18 LAB — TSH: TSH: 2.1 u[IU]/mL (ref 0.35–4.50)

## 2021-04-18 LAB — CBC WITH DIFFERENTIAL/PLATELET
Basophils Absolute: 0 10*3/uL (ref 0.0–0.1)
Basophils Relative: 0.3 % (ref 0.0–3.0)
Eosinophils Absolute: 0.1 10*3/uL (ref 0.0–0.7)
Eosinophils Relative: 3.6 % (ref 0.0–5.0)
HCT: 43.8 % (ref 39.0–52.0)
Hemoglobin: 14.3 g/dL (ref 13.0–17.0)
Lymphocytes Relative: 51.6 % — ABNORMAL HIGH (ref 12.0–46.0)
Lymphs Abs: 1.7 10*3/uL (ref 0.7–4.0)
MCHC: 32.8 g/dL (ref 30.0–36.0)
MCV: 85.7 fl (ref 78.0–100.0)
Monocytes Absolute: 0.3 10*3/uL (ref 0.1–1.0)
Monocytes Relative: 10.5 % (ref 3.0–12.0)
Neutro Abs: 1.1 10*3/uL — ABNORMAL LOW (ref 1.4–7.7)
Neutrophils Relative %: 34 % — ABNORMAL LOW (ref 43.0–77.0)
Platelets: 201 10*3/uL (ref 150.0–400.0)
RBC: 5.11 Mil/uL (ref 4.22–5.81)
RDW: 14.5 % (ref 11.5–15.5)
WBC: 3.3 10*3/uL — ABNORMAL LOW (ref 4.0–10.5)

## 2021-04-18 LAB — LIPID PANEL
Cholesterol: 183 mg/dL (ref 0–200)
HDL: 43.5 mg/dL (ref >39.00)
LDL Cholesterol: 121 mg/dL — ABNORMAL HIGH (ref 0–99)
NonHDL: 139.93
Total CHOL/HDL Ratio: 4
Triglycerides: 97 mg/dL (ref 0.0–149.0)
VLDL: 19.4 mg/dL (ref 0.0–40.0)

## 2021-04-18 LAB — VITAMIN D 25 HYDROXY (VIT D DEFICIENCY, FRACTURES): VITD: <7 ng/mL — ABNORMAL LOW (ref 30.00–100.00)

## 2021-04-18 LAB — HEMOGLOBIN A1C: Hgb A1c MFr Bld: 5.9 % (ref 4.6–6.5)

## 2021-04-18 LAB — VITAMIN B12: Vitamin B-12: 370 pg/mL (ref 211–911)

## 2021-04-18 NOTE — Patient Instructions (Signed)
-Nice seeing you today!!  -Lab work today; will notify you once results are available.  -Remember your COVID booster, consider HPV vaccine.  -Referral for colonoscopy.  -Schedule follow up in 3-6 months.   Preventive Care 58-31 Years Old, Male Preventive care refers to lifestyle choices and visits with your health care provider that can promote health and wellness. This includes:  A yearly physical exam. This is also called an annual wellness visit.  Regular dental and eye exams.  Immunizations.  Screening for certain conditions.  Healthy lifestyle choices, such as: ? Eating a healthy diet. ? Getting regular exercise. ? Not using drugs or products that contain nicotine and tobacco. ? Limiting alcohol use. What can I expect for my preventive care visit? Physical exam Your health care provider may check your:  Height and weight. These may be used to calculate your BMI (body mass index). BMI is a measurement that tells if you are at a healthy weight.  Heart rate and blood pressure.  Body temperature.  Skin for abnormal spots. Counseling Your health care provider may ask you questions about your:  Past medical problems.  Family's medical history.  Alcohol, tobacco, and drug use.  Emotional well-being.  Home life and relationship well-being.  Sexual activity.  Diet, exercise, and sleep habits.  Work and work Astronomer.  Access to firearms. What immunizations do I need? Vaccines are usually given at various ages, according to a schedule. Your health care provider will recommend vaccines for you based on your age, medical history, and lifestyle or other factors, such as travel or where you work.   What tests do I need? Blood tests  Lipid and cholesterol levels. These may be checked every 5 years starting at age 17.  Hepatitis C test.  Hepatitis B test. Screening  Diabetes screening. This is done by checking your blood sugar (glucose) after you have not  eaten for a while (fasting).  Genital exam to check for testicular cancer or hernias.  STD (sexually transmitted disease) testing, if you are at risk. Talk with your health care provider about your test results, treatment options, and if necessary, the need for more tests.   Follow these instructions at home: Eating and drinking  Eat a healthy diet that includes fresh fruits and vegetables, whole grains, lean protein, and low-fat dairy products.  Drink enough fluid to keep your urine pale yellow.  Take vitamin and mineral supplements as recommended by your health care provider.  Do not drink alcohol if your health care provider tells you not to drink.  If you drink alcohol: ? Limit how much you have to 0-2 drinks a day. ? Be aware of how much alcohol is in your drink. In the [place] one drink equals one 12 oz bottle of beer (355 mL), one 5 oz glass of wine (148 mL), or one 1 oz glass of hard liquor (44 mL).   Lifestyle  Take daily care of your teeth and gums. Brush your teeth every morning and night with fluoride toothpaste. Floss one time each day.  Stay active. Exercise for at least 30 minutes 5 or more days each week.  Do not use any products that contain nicotine or tobacco, such as cigarettes, e-cigarettes, and chewing tobacco. If you need help quitting, ask your health care provider.  Do not use drugs.  If you are sexually active, practice safe sex. Use a condom or other form of protection to prevent STIs (sexually transmitted infections).  Find healthy ways to  cope with stress, such as: ? Meditation, yoga, or listening to music. ? Journaling. ? Talking to a trusted person. ? Spending time with friends and family. Safety  Always wear your seat belt while driving or riding in a vehicle.  Do not drive: ? If you have been drinking alcohol. Do not ride with someone who has been drinking. ? When you are tired or distracted. ? While texting.  Wear a helmet and other  protective equipment during sports activities.  If you have firearms in your house, make sure you follow all gun safety procedures.  Seek help if you have been physically or sexually abused. What's next?  Go to your health care provider once a year for an annual wellness visit.  Ask your health care provider how often you should have your eyes and teeth checked.  Stay up to date on all vaccines. This information is not intended to replace advice given to you by your health care provider. Make sure you discuss any questions you have with your health care provider. Document Revised: 08/19/2019 Document Reviewed: 11/27/2018 Elsevier Patient Education  2021 [employer]vier Inc.   Tobacco Use Disorder Tobacco use disorder (TUD) occurs when a person craves, seeks, and uses tobacco, regardless of the consequences. This disorder can cause problems with mental and physical health. It can affect your ability to have healthy relationships, and it can keep you from meeting your responsibilities at work, home, or school. Tobacco may be:  Smoked as a cigarette or cigar.  Inhaled using e-cigarettes.  Smoked in a pipe or hookah.  Chewed as smokeless tobacco.  Inhaled into the nostrils as snuff. Tobacco products contain a dangerous chemical called nicotine, which is very addictive. Nicotine triggers hormones that make the body feel stimulated and works on areas of the brain that make you feel good. These effects can make it hard for people to quit nicotine. Tobacco contains many other unsafe chemicals that can damage almost every organ in the body. Smoking tobacco also puts others in danger due to fire risk and possible health problems caused by breathing in secondhand smoke. What are the signs or symptoms? Symptoms of TUD may include:  Being unable to slow down or stop your tobacco use.  Spending an abnormal amount of time getting or using tobacco.  Craving tobacco. Cravings may last for up to 6  months after quitting.  Tobacco use that: ? Interferes with your work, school, or home life. ? Interferes with your personal and social relationships. ? Makes you give up activities that you once enjoyed or found important.  Using tobacco even though you know that it is: ? Dangerous or bad for your health or someone else's health. ? Causing problems in your life.  Needing more and more of the substance to get the same effect (developing tolerance).  Experiencing unpleasant symptoms if you do not use the substance (withdrawal). Withdrawal symptoms may include: ? Depressed, anxious, or irritable mood. ? Difficulty concentrating. ? Increased appetite. ? Restlessness or trouble sleeping.  Using the substance to avoid withdrawal. How is this diagnosed? This condition may be diagnosed based on:  Your current and past tobacco use. Your health care provider may ask questions about how your tobacco use affects your life.  A physical exam. You may be diagnosed with TUD if you have at least two symptoms within a 84-month period. How is this treated? This condition is treated by stopping tobacco use. Many people are unable to quit on their own and  need help. Treatment may include:  Nicotine replacement therapy (NRT). NRT provides nicotine without the other harmful chemicals in tobacco. NRT gradually lowers the dosage of nicotine in the body and reduces withdrawal symptoms. NRT is available as: ? Over-the-counter gums, lozenges, and skin patches. ? Prescription mouth inhalers and nasal sprays.  Medicine that acts on the brain to reduce cravings and withdrawal symptoms.  A type of talk therapy that examines your triggers for tobacco use, how to avoid them, and how to cope with cravings (behavioral therapy).  Hypnosis. This may help with withdrawal symptoms.  Joining a support group for others coping with TUD. The best treatment for TUD is usually a combination of medicine, talk therapy,  and support groups. Recovery can be a long process. Many people start using tobacco again after stopping (relapse). If you relapse, it does not mean that treatment will not work. Follow these instructions at home: Lifestyle  Do not use any products that contain nicotine or tobacco, such as cigarettes and e-cigarettes.  Avoid things that trigger tobacco use as much as you can. Triggers include people and situations that usually cause you to use tobacco.  Avoid drinks that contain caffeine, including coffee. These may worsen some withdrawal symptoms.  Find ways to manage stress. Wanting to smoke may cause stress, and stress can make you want to smoke. Relaxation techniques such as deep breathing, meditation, and yoga may help.  Attend support groups as needed. These groups are an important part of long-term recovery for many people. General instructions  Take over-the-counter and prescription medicines only as told by your health care provider.  Check with your health care provider before taking any new prescription or over-the-counter medicines.  Decide on a friend, family member, or smoking quit-line (such as 1-800-QUIT-NOW in the [place] that you can call or text when you feel the urge to smoke or when you need help coping with cravings.  Keep all follow-up visits as told by your health care provider and therapist. This is important.   Contact a health care provider if:  You are not able to take your medicines as prescribed.  Your symptoms get worse, even with treatment. Summary  Tobacco use disorder (TUD) occurs when a person craves, seeks, and uses tobacco regardless of the consequences.  This condition may be diagnosed based on your current and past tobacco use and a physical exam.  Many people are unable to quit on their own and need help. Recovery can be a long process.  The most effective treatment for TUD is usually a combination of medicine, talk therapy, and support  groups. This information is not intended to replace advice given to you by your health care provider. Make sure you discuss any questions you have with your health care provider. Document Revised: 11/20/2017 Document Reviewed: 11/20/2017 Elsevier Patient Education  2021 [employer].

## 2021-04-18 NOTE — Progress Notes (Signed)
New Patient Office Visit     This visit occurred during the SARS-CoV-2 public health emergency.  Safety protocols were in place, including screening questions prior to the visit, additional usage of staff PPE, and extensive cleaning of exam room while observing appropriate contact time as indicated for disinfecting solutions.    CC/Reason for Visit: Establish care, annual preventive exam Previous PCP: None Last Visit: Unknown  HPI: Cody Hughes is a 30 y.o. male who is coming in today for the above mentioned reasons. Past Medical History is significant for: Morbid obesity.  He assembles [employer], he is married, he has 2 children ages 2 and 1.  He has been a smoker of 1/2 pack a day for 6 years.  He does not drink, he has no known drug allergies, no past surgical history.  His family history is significant for a sister who died from colon cancer at age 54, mother with hypertension, father with coronary artery disease status post an MI at age 77, he also has hypertension.  He has no acute complaints today.  He does not have routine eye and dental care.  He has had 2 COVID vaccines, he had a Tdap booster last year.    Past Medical/Surgical History: Past Medical History:  Diagnosis Date  . Family history of premature CAD   . Morbid obesity (HCC)   . Nicotine dependence     History reviewed. No pertinent surgical history.  Social History:  reports that he has been smoking cigarettes. He has been smoking about 0.50 packs per day. He has never used smokeless tobacco. He reports current alcohol use. He reports that he does not use drugs.  Allergies: No Known Allergies  Family History:  Family History  Problem Relation Age of Onset  . Hypertension Mother   . CAD Father   . Hypertension Father   . Colon cancer Sister      Current Outpatient Medications:  .  ibuprofen (ADVIL,MOTRIN) 600 MG tablet, Take 1 tablet (600 mg total) by mouth 4 (four) times daily., Disp: 30  tablet, Rfl: 0  Review of Systems:  Constitutional: Denies fever, chills, diaphoresis, appetite change and fatigue.  HEENT: Denies photophobia, eye pain, redness, hearing loss, ear pain, congestion, sore throat, rhinorrhea, sneezing, mouth sores, trouble swallowing, neck pain, neck stiffness and tinnitus.   Respiratory: Denies SOB, DOE, cough, chest tightness,  and wheezing.   Cardiovascular: Denies chest pain, palpitations and leg swelling.  Gastrointestinal: Denies nausea, vomiting, abdominal pain, diarrhea, constipation, blood in stool and abdominal distention.  Genitourinary: Denies dysuria, urgency, frequency, hematuria, flank pain and difficulty urinating.  Endocrine: Denies: hot or cold intolerance, sweats, changes in hair or nails, polyuria, polydipsia. Musculoskeletal: Denies myalgias, back pain, joint swelling, arthralgias and gait problem.  Skin: Denies pallor, rash and wound.  Neurological: Denies dizziness, seizures, syncope, weakness, light-headedness, numbness and headaches.  Hematological: Denies adenopathy. Easy bruising, personal or family bleeding history  Psychiatric/Behavioral: Denies suicidal ideation, mood changes, confusion, nervousness, sleep disturbance and agitation    Physical Exam: Vitals:   04/18/21 0929  BP: 110/70  Pulse: 69  Temp: 98 F (36.7 C)  TempSrc: Oral  SpO2: 98%  Weight: 264 lb 14.4 oz (120.2 kg)  Height: 5\' 5"  (1.651 m)   Body mass index is 44.08 kg/m.   Constitutional: NAD, calm, comfortable, obese Eyes: PERRL, lids and conjunctivae normal ENMT: Mucous membranes are moist. Posterior pharynx clear of any exudate or lesions. Normal dentition. Tympanic membrane is pearly white, no  erythema or bulging. Neck: normal, supple, no masses, no thyromegaly Respiratory: clear to auscultation bilaterally, no wheezing, no crackles. Normal respiratory effort. No accessory muscle use.  Cardiovascular: Regular rate and rhythm, no murmurs / rubs /  gallops. No extremity edema. 2+ pedal pulses. No carotid bruits.  Abdomen: no tenderness, no masses palpated. No hepatosplenomegaly. Bowel sounds positive.  Musculoskeletal: no clubbing / cyanosis. No joint deformity upper and lower extremities. Good ROM, no contractures. Normal muscle tone.  Skin: no rashes, lesions, ulcers. No induration Neurologic: CN 2-12 grossly intact. Sensation intact, DTR normal. Strength 5/5 in all 4.  Psychiatric: Normal judgment and insight. Alert and oriented x 3. Normal mood.    Impression and Plan:  Encounter for preventive health examination  -Have advised routine eye and dental care. -He is due for COVID booster, advised to obtain at pharmacy.  He is also due for HPV vaccination, he will consider this and we will address again at subsequent visit. -Screening labs today. -Healthy lifestyle discussed in detail. -Refer to GI for early colon cancer screening due to his family history.  Morbid obesity (HCC) -Discussed healthy lifestyle, including increased physical activity and better food choices to promote weight loss.  Family history of premature CAD  -Have advised risk factor modification, to include weight loss. -Check lipids and A1c today.  Cigarette nicotine dependence without complication -I have discussed tobacco cessation with the patient.  I have counseled the patient regarding the negative impacts of continued tobacco use including but not limited to lung cancer, COPD, and cardiovascular disease.  I have discussed alternatives to tobacco and modalities that may help facilitate tobacco cessation including but not limited to biofeedback, hypnosis, and medications.  Total time spent with tobacco counseling was 4 minutes. -He remains in the precontemplative stage, I will continue to address with him at subsequent visits.      Patient Instructions   -Nice seeing you today!!  -Lab work today; will notify you once results are  available.  -Remember your COVID booster, consider HPV vaccine.  -Referral for colonoscopy.  -Schedule follow up in 3-6 months.   Preventive Care 60-82 Years Old, Male Preventive care refers to lifestyle choices and visits with your health care provider that can promote health and wellness. This includes:  A yearly physical exam. This is also called an annual wellness visit.  Regular dental and eye exams.  Immunizations.  Screening for certain conditions.  Healthy lifestyle choices, such as: ? Eating a healthy diet. ? Getting regular exercise. ? Not using drugs or products that contain nicotine and tobacco. ? Limiting alcohol use. What can I expect for my preventive care visit? Physical exam Your health care provider may check your:  Height and weight. These may be used to calculate your BMI (body mass index). BMI is a measurement that tells if you are at a healthy weight.  Heart rate and blood pressure.  Body temperature.  Skin for abnormal spots. Counseling Your health care provider may ask you questions about your:  Past medical problems.  Family's medical history.  Alcohol, tobacco, and drug use.  Emotional well-being.  Home life and relationship well-being.  Sexual activity.  Diet, exercise, and sleep habits.  Work and work Astronomer.  Access to firearms. What immunizations do I need? Vaccines are usually given at various ages, according to a schedule. Your health care provider will recommend vaccines for you based on your age, medical history, and lifestyle or other factors, such as travel or where you work.  What tests do I need? Blood tests  Lipid and cholesterol levels. These may be checked every 5 years starting at age 7.  Hepatitis C test.  Hepatitis B test. Screening  Diabetes screening. This is done by checking your blood sugar (glucose) after you have not eaten for a while (fasting).  Genital exam to check for testicular cancer  or hernias.  STD (sexually transmitted disease) testing, if you are at risk. Talk with your health care provider about your test results, treatment options, and if necessary, the need for more tests.   Follow these instructions at home: Eating and drinking  Eat a healthy diet that includes fresh fruits and vegetables, whole grains, lean protein, and low-fat dairy products.  Drink enough fluid to keep your urine pale yellow.  Take vitamin and mineral supplements as recommended by your health care provider.  Do not drink alcohol if your health care provider tells you not to drink.  If you drink alcohol: ? Limit how much you have to 0-2 drinks a day. ? Be aware of how much alcohol is in your drink. In the [place] one drink equals one 12 oz bottle of beer (355 mL), one 5 oz glass of wine (148 mL), or one 1 oz glass of hard liquor (44 mL).   Lifestyle  Take daily care of your teeth and gums. Brush your teeth every morning and night with fluoride toothpaste. Floss one time each day.  Stay active. Exercise for at least 30 minutes 5 or more days each week.  Do not use any products that contain nicotine or tobacco, such as cigarettes, e-cigarettes, and chewing tobacco. If you need help quitting, ask your health care provider.  Do not use drugs.  If you are sexually active, practice safe sex. Use a condom or other form of protection to prevent STIs (sexually transmitted infections).  Find healthy ways to cope with stress, such as: ? Meditation, yoga, or listening to music. ? Journaling. ? Talking to a trusted person. ? Spending time with friends and family. Safety  Always wear your seat belt while driving or riding in a vehicle.  Do not drive: ? If you have been drinking alcohol. Do not ride with someone who has been drinking. ? When you are tired or distracted. ? While texting.  Wear a helmet and other protective equipment during sports activities.  If you have firearms in your  house, make sure you follow all gun safety procedures.  Seek help if you have been physically or sexually abused. What's next?  Go to your health care provider once a year for an annual wellness visit.  Ask your health care provider how often you should have your eyes and teeth checked.  Stay up to date on all vaccines. This information is not intended to replace advice given to you by your health care provider. Make sure you discuss any questions you have with your health care provider. Document Revised: 08/19/2019 Document Reviewed: 11/27/2018 Elsevier Patient Education  2021 Elsevier Inc.   Tobacco Use Disorder Tobacco use disorder (TUD) occurs when a person craves, seeks, and uses tobacco, regardless of the consequences. This disorder can cause problems with mental and physical health. It can affect your ability to have healthy relationships, and it can keep you from meeting your responsibilities at work, home, or school. Tobacco may be:  Smoked as a cigarette or cigar.  Inhaled using e-cigarettes.  Smoked in a pipe or hookah.  Chewed as smokeless tobacco.  Inhaled  into the nostrils as snuff. Tobacco products contain a dangerous chemical called nicotine, which is very addictive. Nicotine triggers hormones that make the body feel stimulated and works on areas of the brain that make you feel good. These effects can make it hard for people to quit nicotine. Tobacco contains many other unsafe chemicals that can damage almost every organ in the body. Smoking tobacco also puts others in danger due to fire risk and possible health problems caused by breathing in secondhand smoke. What are the signs or symptoms? Symptoms of TUD may include:  Being unable to slow down or stop your tobacco use.  Spending an abnormal amount of time getting or using tobacco.  Craving tobacco. Cravings may last for up to 6 months after quitting.  Tobacco use that: ? Interferes with your work, school,  or home life. ? Interferes with your personal and social relationships. ? Makes you give up activities that you once enjoyed or found important.  Using tobacco even though you know that it is: ? Dangerous or bad for your health or someone else's health. ? Causing problems in your life.  Needing more and more of the substance to get the same effect (developing tolerance).  Experiencing unpleasant symptoms if you do not use the substance (withdrawal). Withdrawal symptoms may include: ? Depressed, anxious, or irritable mood. ? Difficulty concentrating. ? Increased appetite. ? Restlessness or trouble sleeping.  Using the substance to avoid withdrawal. How is this diagnosed? This condition may be diagnosed based on:  Your current and past tobacco use. Your health care provider may ask questions about how your tobacco use affects your life.  A physical exam. You may be diagnosed with TUD if you have at least two symptoms within a 1732-month period. How is this treated? This condition is treated by stopping tobacco use. Many people are unable to quit on their own and need help. Treatment may include:  Nicotine replacement therapy (NRT). NRT provides nicotine without the other harmful chemicals in tobacco. NRT gradually lowers the dosage of nicotine in the body and reduces withdrawal symptoms. NRT is available as: ? Over-the-counter gums, lozenges, and skin patches. ? Prescription mouth inhalers and nasal sprays.  Medicine that acts on the brain to reduce cravings and withdrawal symptoms.  A type of talk therapy that examines your triggers for tobacco use, how to avoid them, and how to cope with cravings (behavioral therapy).  Hypnosis. This may help with withdrawal symptoms.  Joining a support group for others coping with TUD. The best treatment for TUD is usually a combination of medicine, talk therapy, and support groups. Recovery can be a long process. Many people start using tobacco  again after stopping (relapse). If you relapse, it does not mean that treatment will not work. Follow these instructions at home: Lifestyle  Do not use any products that contain nicotine or tobacco, such as cigarettes and e-cigarettes.  Avoid things that trigger tobacco use as much as you can. Triggers include people and situations that usually cause you to use tobacco.  Avoid drinks that contain caffeine, including coffee. These may worsen some withdrawal symptoms.  Find ways to manage stress. Wanting to smoke may cause stress, and stress can make you want to smoke. Relaxation techniques such as deep breathing, meditation, and yoga may help.  Attend support groups as needed. These groups are an important part of long-term recovery for many people. General instructions  Take over-the-counter and prescription medicines only as told by your health care  provider.  Check with your health care provider before taking any new prescription or over-the-counter medicines.  Decide on a friend, family member, or smoking quit-line (such as 1-800-QUIT-NOW in the [place] that you can call or text when you feel the urge to smoke or when you need help coping with cravings.  Keep all follow-up visits as told by your health care provider and therapist. This is important.   Contact a health care provider if:  You are not able to take your medicines as prescribed.  Your symptoms get worse, even with treatment. Summary  Tobacco use disorder (TUD) occurs when a person craves, seeks, and uses tobacco regardless of the consequences.  This condition may be diagnosed based on your current and past tobacco use and a physical exam.  Many people are unable to quit on their own and need help. Recovery can be a long process.  The most effective treatment for TUD is usually a combination of medicine, talk therapy, and support groups. This information is not intended to replace advice given to you by your health  care provider. Make sure you discuss any questions you have with your health care provider. Document Revised: 11/20/2017 Document Reviewed: 11/20/2017 Elsevier Patient Education  2021 Elsevier Inc.       Chaya Jan, MD [facility]

## 2021-04-19 ENCOUNTER — Other Ambulatory Visit: Payer: Self-pay | Admitting: Internal Medicine

## 2021-04-19 ENCOUNTER — Encounter: Payer: Self-pay | Admitting: Internal Medicine

## 2021-04-19 DIAGNOSIS — E559 Vitamin D deficiency, unspecified: Secondary | ICD-10-CM

## 2021-04-19 DIAGNOSIS — R7302 Impaired glucose tolerance (oral): Secondary | ICD-10-CM | POA: Insufficient documentation

## 2021-04-19 DIAGNOSIS — E785 Hyperlipidemia, unspecified: Secondary | ICD-10-CM | POA: Insufficient documentation

## 2021-04-19 MED ORDER — VITAMIN D (ERGOCALCIFEROL) 1.25 MG (50000 UNIT) PO CAPS: 50000.0000 [IU] | ORAL_CAPSULE | ORAL | 0 refills | Status: AC

## 2021-04-21 ENCOUNTER — Other Ambulatory Visit: Payer: Self-pay | Admitting: Internal Medicine

## 2021-04-21 DIAGNOSIS — E559 Vitamin D deficiency, unspecified: Secondary | ICD-10-CM

## 2021-04-21 DIAGNOSIS — R7302 Impaired glucose tolerance (oral): Secondary | ICD-10-CM

## 2021-05-24 ENCOUNTER — Ambulatory Visit (INDEPENDENT_AMBULATORY_CARE_PROVIDER_SITE_OTHER): Payer: Managed Care, Other (non HMO) | Admitting: Family Medicine

## 2021-05-24 ENCOUNTER — Encounter: Payer: Self-pay | Admitting: Family Medicine

## 2021-05-24 ENCOUNTER — Other Ambulatory Visit: Payer: Self-pay

## 2021-05-24 VITALS — BP 120/84 | HR 75 | Temp 98.2°F | Wt 264.2 lb

## 2021-05-24 DIAGNOSIS — M545 Low back pain, unspecified: Secondary | ICD-10-CM | POA: Diagnosis not present

## 2021-05-24 MED ORDER — MELOXICAM 15 MG PO TABS: 15.0000 mg | ORAL_TABLET | Freq: Every day | ORAL | 0 refills | Status: DC

## 2021-05-24 MED ORDER — CYCLOBENZAPRINE HCL 10 MG PO TABS: 10.0000 mg | ORAL_TABLET | Freq: Every day | ORAL | 0 refills | Status: DC
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
## 2021-05-24 NOTE — Patient Instructions (Signed)
Lumbosacral Strain Lumbosacral strain is an injury that causes pain in the lower back (lumbosacral spine). This injury usually happens from overstretching the muscles or ligaments along your spine. Ligaments are cord-like tissues that connect bones to other bones. A strain can affect one or more muscles or ligaments. What are the causes? This condition may be caused by:  A hard, direct hit to the back.  Overstretching the lower back muscles. This may result from: ? A fall. ? Lifting something heavy. ? Repetitive movements such as bending or crouching. What increases the risk? The following factors may make you more likely to develop this condition:  Participating in sports or activities that involve: ? A sudden twist of the back. ? Pushing or pulling motions.  Being overweight or obese.  Having poor strength and flexibility, especially tight hamstrings or weak muscles in the back or abdomen.  Having too much of a curve in the lower back.  Having a pelvis that is tilted forward. What are the signs or symptoms? The main symptom of this condition is pain in the lower back, at the site of the strain. Pain may also be felt down one or both legs. How is this diagnosed? This condition is diagnosed based on your symptoms, your medical history, and a physical exam. During the physical exam, your health care provider may push on certain areas of your back to find the source of your pain. You may be asked to bend forward, backward, and side to side to check your pain and range of motion. You may also have imaging tests, such as X-rays and an MRI. How is this treated? This condition may be treated by:  Applying heat and cold on the affected area.  Taking medicines to help relieve pain and relax your muscles.  Taking NSAIDs, such as ibuprofen, to help reduce swelling and discomfort.  Doing stretching and strengthening exercises for your lower back. Symptoms usually improve within several  weeks of treatment. However, recovery time varies. When your symptoms improve, gradually return to your normal routine as soon as possible to reduce pain, avoid stiffness, and keep muscle strength. Follow these instructions at home: Medicines  Take over-the-counter and prescription medicines only as told by your health care provider.  Ask your health care provider if the medicine prescribed to you: ? Requires you to avoid driving or using heavy machinery. ? Can cause constipation. You may need to take these actions to prevent or treat constipation:  Drink enough fluid to keep your urine pale yellow.  Take over-the-counter or prescription medicines.  Eat foods that are high in fiber, such as beans, whole grains, and fresh fruits and vegetables.  Limit foods that are high in fat and processed sugars, such as fried or sweet foods. Managing pain, stiffness, and swelling  If directed, put ice on the injured area. To do this: ? Put ice in a plastic bag. ? Place a towel between your skin and the bag. ? Leave the ice on for 20 minutes, 2-3 times a day.  If directed, apply heat on the affected area as often as told by your health care provider. Use the heat source that your health care provider recommends, such as a moist heat pack or a heating pad. ? Place a towel between your skin and the heat source. ? Leave the heat on for 20-30 minutes. ? Remove the heat if your skin turns bright red. This is especially important if you are unable to feel pain, heat, or   cold. You may have a greater risk of getting burned.      Activity  Rest as told by your health care provider.  Do not stay in bed. Staying in bed for more than 1-2 days can delay your recovery.  Return to your normal activities as told by your health care provider. Ask your health care provider what activities are safe for you.  Avoid activities that take a lot of energy for as long as told by your health care provider.  Do  exercises as told by your health care provider. This includes stretching and strengthening exercises. General instructions  Sit up and stand up straight. Avoid leaning forward when you sit, or hunching over when you stand.  Do not use any products that contain nicotine or tobacco, such as cigarettes, e-cigarettes, and chewing tobacco. If you need help quitting, ask your health care provider.  Keep all follow-up visits as told by your health care provider. This is important. How is this prevented?  Use correct form when playing sports and lifting heavy objects.  Use good posture when sitting and standing.  Maintain a healthy weight.  Sleep on a mattress with medium firmness to support your back.  Do at least 150 minutes of moderate-intensity exercise each week, such as brisk walking or water aerobics. Try a form of exercise that takes stress off your back, such as swimming or stationary cycling.  Maintain physical fitness, including: ? Strength. ? Flexibility.   Contact a health care provider if:  Your back pain does not improve after several weeks of treatment.  Your symptoms get worse. Get help right away if:  Your back pain is severe.  You cannot stand or walk.  You have difficulty controlling when you urinate or when you have a bowel movement.  You feel nauseous or you vomit.  Your feet or legs get very cold, turn pale, or look blue.  You have numbness, tingling, weakness, or problems using your arms or legs.  You develop any of the following: ? Shortness of breath. ? Dizziness. ? Pain in your legs. ? Weakness in your buttocks or legs. Summary  Lumbosacral strain is an injury that causes pain in the lower back (lumbosacral spine).  This injury usually happens from overstretching the muscles or ligaments along your spine.  This condition may be caused by a direct hit to the lower back or by overstretching the lower back muscles.  Symptoms usually improve  within several weeks of treatment. This information is not intended to replace advice given to you by your health care provider. Make sure you discuss any questions you have with your health care provider. Document Revised: 04/28/2019 Document Reviewed: 04/28/2019 [employer] Patient Education  2021 [employer]  

## 2021-07-19 ENCOUNTER — Ambulatory Visit: Payer: Managed Care, Other (non HMO) | Admitting: Internal Medicine

## 2021-07-26 ENCOUNTER — Ambulatory Visit: Payer: Managed Care, Other (non HMO) | Admitting: Internal Medicine

## 2024-05-05 ENCOUNTER — Emergency Department (HOSPITAL_BASED_OUTPATIENT_CLINIC_OR_DEPARTMENT_OTHER)
Admission: EM | Admit: 2024-05-05 | Discharge: 2024-05-05 | Disposition: A | Discharge status: Home / Self Care | Attending: Emergency Medicine | Admitting: Emergency Medicine

## 2024-05-05 ENCOUNTER — Other Ambulatory Visit: Payer: Self-pay

## 2024-05-05 ENCOUNTER — Emergency Department (HOSPITAL_BASED_OUTPATIENT_CLINIC_OR_DEPARTMENT_OTHER)

## 2024-05-05 ENCOUNTER — Encounter (HOSPITAL_BASED_OUTPATIENT_CLINIC_OR_DEPARTMENT_OTHER): Payer: Self-pay

## 2024-05-05 DIAGNOSIS — S99912A Unspecified injury of left ankle, initial encounter: Secondary | ICD-10-CM | POA: Diagnosis present

## 2024-05-05 DIAGNOSIS — X501XXA Overexertion from prolonged static or awkward postures, initial encounter: Secondary | ICD-10-CM | POA: Diagnosis not present

## 2024-05-05 DIAGNOSIS — S93402A Sprain of unspecified ligament of left ankle, initial encounter: Secondary | ICD-10-CM | POA: Diagnosis not present

## 2024-05-05 MED ORDER — NAPROXEN 500 MG PO TABS: 500.0000 mg | ORAL_TABLET | Freq: Two times a day (BID) | ORAL | 0 refills | Status: AC

## 2024-05-05 MED ORDER — NAPROXEN 250 MG PO TABS
500.0000 mg | ORAL_TABLET | Freq: Once | ORAL | Status: AC
  Administered 2024-05-05: 500 mg via ORAL
  Filled 2024-05-05: qty 2

## 2024-05-05 NOTE — ED Provider Notes (Signed)
   [place] [facility]  Provider Note  CSN: [id number] Arrival date & time: 05/05/24 0023  History Chief Complaint  Patient presents with   Ankle Pain    Cody Hughes is a 33 y.o. male reports he twisted his L ankle as he stepped off his porch earlier tonight. Complaining of pain to lateral ankle.    Home Medications Prior to Admission medications   Medication Sig Start Date End Date Taking? Authorizing Provider  naproxen  (NAPROSYN ) 500 MG tablet Take 1 tablet (500 mg total) by mouth 2 (two) times daily. 05/05/24  Yes Charmayne Cooper, MD     Allergies    Patient has no known allergies.   Review of Systems   Review of Systems Please see HPI for pertinent positives and negatives  Physical Exam BP 136/78 (BP Location: Left Arm)   Pulse 93   Temp 97.8 F (36.6 C) (Oral)   Resp 18   Wt 122.5 kg   SpO2 100%   BMI 44.93 kg/m   Physical Exam Vitals and nursing note reviewed.  HENT:     Head: Normocephalic.     Nose: Nose normal.  Eyes:     Extraocular Movements: Extraocular movements intact.  Cardiovascular:     Pulses: Normal pulses.  Pulmonary:     Effort: Pulmonary effort is normal.  Musculoskeletal:        General: Swelling and tenderness (lateral malleolus) present. Normal range of motion.     Cervical back: Neck supple.  Skin:    Findings: No rash (on exposed skin).  Neurological:     Mental Status: He is alert and oriented to person, place, and time.  Psychiatric:        Mood and Affect: Mood normal.     ED Results / Procedures / Treatments   EKG None  Procedures Procedures  Medications Ordered in the ED Medications  naproxen  (NAPROSYN ) tablet 500 mg (has no administration in time range)    Initial Impression and Plan  Patient here with L ankle injury, I personally viewed the images from radiology studies and agree with radiologist interpretation: Xray is neg. Will treat for sprain with ASO, crutches,  naprosyn . Recommend rest, ice and elevation. PCP or Ortho follow up if not improving, RTED for any other concerns.    ED Course       MDM Rules/Calculators/A&P Medical Decision Making Problems Addressed: Sprain of left ankle, unspecified ligament, initial encounter: acute illness or injury  Amount and/or Complexity of Data Reviewed Radiology: ordered and independent interpretation performed. Decision-making details documented in ED Course.  Risk Prescription drug management.     Final Clinical Impression(s) / ED Diagnoses Final diagnoses:  Sprain of left ankle, unspecified ligament, initial encounter    Rx / DC Orders ED Discharge Orders          Ordered    naproxen  (NAPROSYN ) 500 MG tablet  2 times daily        05/05/24 0232             Charmayne Cooper, MD 05/05/24 [phone]

## 2024-05-05 NOTE — ED Triage Notes (Signed)
 Pt arrives with c/o left ankle pain that started today. Pt stepped off his porch the wrong way and twisted his ankle. Pt ambulatory to triage.
